# Patient Record
Sex: Male | Born: 1995 | Race: White | Hispanic: No | Marital: Single | Smoking: Current every day smoker
Health system: Southern US, Community
[De-identification: ages and names within clinical notes are randomized; demographics above are authoritative.]

## PROBLEM LIST (undated history)

## (undated) DIAGNOSIS — J329 Chronic sinusitis, unspecified: Secondary | ICD-10-CM

## (undated) HISTORY — PX: APPENDECTOMY: SHX54

---
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
## 2013-08-02 ENCOUNTER — Emergency Department (HOSPITAL_BASED_OUTPATIENT_CLINIC_OR_DEPARTMENT_OTHER)
Admission: EM | Admit: 2013-08-02 | Discharge: 2013-08-02 | Disposition: A | Discharge status: Home / Self Care | Payer: No Typology Code available for payment source | Attending: Emergency Medicine | Admitting: Emergency Medicine

## 2013-08-02 ENCOUNTER — Encounter (HOSPITAL_BASED_OUTPATIENT_CLINIC_OR_DEPARTMENT_OTHER): Payer: Self-pay | Admitting: Emergency Medicine

## 2013-08-02 ENCOUNTER — Emergency Department (HOSPITAL_BASED_OUTPATIENT_CLINIC_OR_DEPARTMENT_OTHER): Payer: No Typology Code available for payment source

## 2013-08-02 DIAGNOSIS — R55 Syncope and collapse: Secondary | ICD-10-CM | POA: Insufficient documentation

## 2013-08-02 DIAGNOSIS — S1093XA Contusion of unspecified part of neck, initial encounter: Secondary | ICD-10-CM

## 2013-08-02 DIAGNOSIS — Y92410 Unspecified street and highway as the place of occurrence of the external cause: Secondary | ICD-10-CM | POA: Insufficient documentation

## 2013-08-02 DIAGNOSIS — S060XAA Concussion with loss of consciousness status unknown, initial encounter: Secondary | ICD-10-CM | POA: Insufficient documentation

## 2013-08-02 DIAGNOSIS — S0990XA Unspecified injury of head, initial encounter: Secondary | ICD-10-CM

## 2013-08-02 DIAGNOSIS — S0083XA Contusion of other part of head, initial encounter: Secondary | ICD-10-CM

## 2013-08-02 DIAGNOSIS — S0003XA Contusion of scalp, initial encounter: Secondary | ICD-10-CM | POA: Insufficient documentation

## 2013-08-02 DIAGNOSIS — Y9389 Activity, other specified: Secondary | ICD-10-CM | POA: Insufficient documentation

## 2013-08-02 DIAGNOSIS — S060X9A Concussion with loss of consciousness of unspecified duration, initial encounter: Secondary | ICD-10-CM

## 2013-08-02 MED ORDER — TRAMADOL HCL 50 MG PO TABS: 50.0000 mg | ORAL_TABLET | Freq: Four times a day (QID) | ORAL | Status: DC | PRN

## 2013-08-02 NOTE — ED Notes (Signed)
Pt called and requested a return to work note for 3/27 instead of 3/26. Dr Blinda LeatherwoodPollina advised and note will be left at front desk for pt.

## 2013-08-02 NOTE — Discharge Instructions (Signed)

## 2013-08-02 NOTE — ED Notes (Signed)
MD at bedside discussing results. 
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
## 2014-09-23 ENCOUNTER — Encounter (HOSPITAL_COMMUNITY): Payer: Self-pay

## 2014-09-23 ENCOUNTER — Emergency Department (HOSPITAL_COMMUNITY)
Admission: EM | Admit: 2014-09-23 | Discharge: 2014-09-24 | Disposition: A | Discharge status: Other Acute Inpatient Hospital | Payer: No Typology Code available for payment source | Attending: Emergency Medicine | Admitting: Emergency Medicine

## 2014-09-23 DIAGNOSIS — F419 Anxiety disorder, unspecified: Secondary | ICD-10-CM | POA: Insufficient documentation

## 2014-09-23 DIAGNOSIS — F1210 Cannabis abuse, uncomplicated: Secondary | ICD-10-CM | POA: Insufficient documentation

## 2014-09-23 DIAGNOSIS — R45851 Suicidal ideations: Secondary | ICD-10-CM

## 2014-09-23 DIAGNOSIS — F329 Major depressive disorder, single episode, unspecified: Secondary | ICD-10-CM | POA: Insufficient documentation

## 2014-09-23 LAB — COMPREHENSIVE METABOLIC PANEL
ALT: 121 U/L — AB (ref 17–63)
AST: 64 U/L — ABNORMAL HIGH (ref 15–41)
Albumin: 4.6 g/dL (ref 3.5–5.0)
Alkaline Phosphatase: 56 U/L (ref 38–126)
Anion gap: 9 (ref 5–15)
BUN: 7 mg/dL (ref 6–20)
CO2: 25 mmol/L (ref 22–32)
Calcium: 9.4 mg/dL (ref 8.9–10.3)
Chloride: 103 mmol/L (ref 101–111)
Creatinine, Ser: 0.91 mg/dL (ref 0.61–1.24)
GFR calc Af Amer: >60 mL/min (ref >60)
Glucose, Bld: 110 mg/dL — ABNORMAL HIGH (ref 65–99)
Potassium: 4.1 mmol/L (ref 3.5–5.1)
Sodium: 137 mmol/L (ref 135–145)
Total Bilirubin: 0.7 mg/dL (ref 0.3–1.2)
Total Protein: 7.6 g/dL (ref 6.5–8.1)

## 2014-09-23 LAB — CBC
HCT: 45.6 % (ref 39.0–52.0)
Hemoglobin: 16.1 g/dL (ref 13.0–17.0)
MCH: 30.4 pg (ref 26.0–34.0)
MCHC: 35.3 g/dL (ref 30.0–36.0)
MCV: 86 fL (ref 78.0–100.0)
Platelets: 221 10*3/uL (ref 150–400)
RBC: 5.3 MIL/uL (ref 4.22–5.81)
RDW: 12.5 % (ref 11.5–15.5)
WBC: 11.6 10*3/uL — ABNORMAL HIGH (ref 4.0–10.5)

## 2014-09-23 LAB — RAPID URINE DRUG SCREEN, HOSP PERFORMED
Amphetamines: NOT DETECTED
Barbiturates: NOT DETECTED
Benzodiazepines: NOT DETECTED
Cocaine: NOT DETECTED
OPIATES: NOT DETECTED
TETRAHYDROCANNABINOL: POSITIVE — AB

## 2014-09-23 LAB — ETHANOL: Alcohol, Ethyl (B): <5 mg/dL (ref <5)

## 2014-09-23 LAB — SALICYLATE LEVEL

## 2014-09-23 LAB — ACETAMINOPHEN LEVEL: Acetaminophen (Tylenol), Serum: <10 ug/mL — ABNORMAL LOW (ref 10–30)

## 2014-09-23 MED ORDER — NICOTINE 21 MG/24HR TD PT24
21.0000 mg | MEDICATED_PATCH | Freq: Every day | TRANSDERMAL | Status: DC
  Administered 2014-09-24 (×2): 21 mg via TRANSDERMAL
  Filled 2014-09-23 (×2): qty 1

## 2014-09-23 MED ORDER — ZOLPIDEM TARTRATE 5 MG PO TABS: 5.0000 mg | ORAL_TABLET | Freq: Every evening | ORAL | Status: DC | PRN

## 2014-09-23 MED ORDER — LORAZEPAM 1 MG PO TABS
1.0000 mg | ORAL_TABLET | Freq: Three times a day (TID) | ORAL | Status: DC | PRN
  Administered 2014-09-24: 1 mg via ORAL
  Filled 2014-09-23: qty 1

## 2014-09-23 MED ORDER — ONDANSETRON HCL 4 MG PO TABS: 4.0000 mg | ORAL_TABLET | Freq: Three times a day (TID) | ORAL | Status: DC | PRN

## 2014-09-23 MED ORDER — ACETAMINOPHEN 325 MG PO TABS
650.0000 mg | ORAL_TABLET | ORAL | Status: DC | PRN
  Administered 2014-09-24: 650 mg via ORAL
  Filled 2014-09-23: qty 2

## 2014-09-23 NOTE — ED Notes (Signed)
Staffing notified of need for sitter.  
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
## 2014-09-23 NOTE — ED Provider Notes (Signed)
CSN: [id number]     Arrival date & time 09/23/14  2203 History   First MD Initiated Contact with Patient 09/23/14 2212     Chief Complaint  Patient presents with  . Suicidal     (Consider location/radiation/quality/duration/timing/severity/associated sxs/prior Treatment) HPI Comments: Patient is a 19 year old male with no sick and past medical history who presents to the emergency department for further evaluation of suicidal thoughts. Patient states that he has been having worsening suicidal thoughts and depression of the past 5 years. He denies seeing a therapist or psychiatrist in the past. He has never been medicated for his depression, but has attempted self medicating with marijuana. He does not do this anymore, per patient. He states that he has had thoughts of jumping off of a bridge. He does report also trying to hang himself with a belt when he was 19 years old. Patient states that he sometimes is aware that his heart is beating very rapidly. He has had difficulty sleeping over the past 3 days. Patient reporting vague homicidal ideations and agitation as well. No homicidal thoughts. Patient denies any recent alcohol or illicit drug use.  The history is provided by the patient. No language interpreter was used.    History reviewed. No pertinent past medical history. History reviewed. No pertinent past surgical history. History reviewed. No pertinent family history. History  Substance Use Topics  . Smoking status: Never Smoker   . Smokeless tobacco: Not on file  . Alcohol Use: Yes     Comment: ocasional    Review of Systems  Psychiatric/Behavioral: Positive for suicidal ideas and behavioral problems. The patient is nervous/anxious.   All other systems reviewed and are negative.   Allergies  Review of patient's allergies indicates no known allergies.  Home Medications   Prior to Admission medications   Medication Sig Start Date End Date Taking? Authorizing Provider   traMADol (ULTRAM) 50 MG tablet Take 1 tablet (50 mg total) by mouth every 6 (six) hours as needed. Patient not taking: Reported on 09/24/2014 08/02/13   Gilda Creasehristopher J Pollina, MD   BP 161/96 mmHg  Pulse 93  Temp(Src) 98.4 F (36.9 C) (Oral)  Resp 18  Ht 6' (1.829 m)  Wt 245 lb (111.131 kg)  BMI 33.22 kg/m2  SpO2 97%   Physical Exam  Constitutional: He is oriented to person, place, and time. He appears well-developed and well-nourished. No distress.  HENT:  Head: Normocephalic and atraumatic.  Eyes: Conjunctivae and EOM are normal. No scleral icterus.  Neck: Normal range of motion.  Cardiovascular: Normal rate, regular rhythm and intact distal pulses.   Pulmonary/Chest: Effort normal. No respiratory distress.  Musculoskeletal: Normal range of motion.  Neurological: He is alert and oriented to person, place, and time. He exhibits normal muscle tone. Coordination normal.  Skin: Skin is warm and dry. No rash noted. He is not diaphoretic. No erythema. No pallor.  Psychiatric: He has a normal mood and affect. His speech is normal and behavior is normal. He expresses homicidal and suicidal ideation. He expresses suicidal plans. He expresses no homicidal plans.  Patient calm and cooperative  Nursing note and vitals reviewed.   ED Course  Procedures (including critical care time) Labs Review Labs Reviewed  CBC - Abnormal; Notable for the following:    WBC 11.6 (*)    All other components within normal limits  COMPREHENSIVE METABOLIC PANEL - Abnormal; Notable for the following:    Glucose, Bld 110 (*)    AST 64 (*)  ALT 121 (*)    All other components within normal limits  ACETAMINOPHEN LEVEL - Abnormal; Notable for the following:    Acetaminophen (Tylenol), Serum <10 (*)    All other components within normal limits  URINE RAPID DRUG SCREEN (HOSP PERFORMED) - Abnormal; Notable for the following:    Tetrahydrocannabinol POSITIVE (*)    All other components within normal limits   ETHANOL  SALICYLATE LEVEL    Imaging Review No results found.   EKG Interpretation None      MDM   Final diagnoses:  Depression with suicidal ideation    19 y/o male presents for depression and SI. Patient voluntary. He has been medically cleared and pending inpatient placement. Disposition to be determined by oncoming ED provider.   Filed Vitals:   09/23/14 2213  BP: 161/96  Pulse: 93  Temp: 98.4 F (36.9 C)  TempSrc: Oral  Resp: 18  Height: 6' (1.829 m)  Weight: 245 lb (111.131 kg)  SpO2: 97%       Antony MaduraKelly Bassy Fetterly, PA-C 09/24/14 [id number]  Benjiman CoreNathan Pickering, MD 09/26/14 [phone]

## 2014-09-23 NOTE — ED Notes (Signed)
Pt reports Si with plan. sts on the way here tried to open door, has thoughts of jumping off bridge, pt reports general body aches as well, sts thought has been going on for several years.

## 2014-09-24 NOTE — Progress Notes (Signed)
CSW seeking inpatient placement for pt.  Ref[facility] faxed to: [facility] ([facility])- per Iona HansenPaula [facility]- per Jill SideAlison, possible adult male beds today [facility]- [facility]- per Lowella Fair[facility]- per Olivia Mackieressa, bed availability unknown but will review when beds come available  At capacity for adult units: [facility]- [facility]- [facility]- per [facility] ("can check back this evening for changes") [facility]- per [facility]ia  Ilean SkillMeghan Ashara Lounsbury, MSW, LCSWA Clinical Social Work, Disposition  09/24/2014 [phone]

## 2014-09-24 NOTE — ED Notes (Signed)
Nicotine patch removed by patient. Pt sts it is shooting pain throughout his body. Pt given acetaminophen for pain relief

## 2014-09-24 NOTE — ED Notes (Signed)
Pt report given to Till[facility]Laura Blount [facility]

## 2014-09-24 NOTE — BH Assessment (Signed)
Assessment completed. Consulted Ijeoma Nwaeze, NP who agrees that pt meets inpatient criteria. TTS will contact other facilities for placement. Kelly Humes, PA-C has been informed of the recommendation.  

## 2014-09-24 NOTE — Progress Notes (Signed)
Patient being reviewed for potential placement at [facility].  Bernard Waters, MSW, LCSW, LCAS Clinical Social Worker [phone]

## 2014-09-24 NOTE — Progress Notes (Signed)
[facility] by Dr. Wendall StadeKohl per Morrie SheldonAshley. Report [id number]. Morrie Sheldonshley states pt can be admitted voluntarily if willing to sign consents, but will need to be under IVC if not willing to sign himself in. [facility] SW spoke with pt who confirms he is willing to [facility], therefore IVC unneeded at this time.  [facility] regarding pt's placement.   Ilean SkillMeghan Hector Venne, MSW, LCSWA Clinical Social Work, Disposition  09/24/2014 [phone]

## 2014-09-24 NOTE — ED Notes (Signed)
Pt in shower room with sitter at stand by.

## 2014-09-24 NOTE — ED Notes (Signed)
Pt sts belongings are with family

## 2014-09-24 NOTE — ED Notes (Addendum)
Pt reports SI thoughts of jumping off the car while coming here. Also thought of jumping off bridge few days ago when coming from work. Pt states he has access to his relatives guns, thought of using once against himself. Pt states stressors include his job, relationships with family members, and mistakes he has had made along the way in his life.

## 2014-09-24 NOTE — ED Provider Notes (Addendum)
Alert, pleasant cooperative. Sitting up in bed watching TV denies complaint. Results for orders placed or performed during the hospital encounter of 09/23/14  CBC  Result Value Ref Range   WBC 11.6 (H) 4.0 - 10.5 K/uL   RBC 5.30 4.22 - 5.81 MIL/uL   Hemoglobin 16.1 13.0 - 17.0 g/dL   HCT 62.945.6 52.839.0 - 41.352.0 %   MCV 86.0 78.0 - 100.0 fL   MCH 30.4 26.0 - 34.0 pg   MCHC 35.3 30.0 - 36.0 g/dL   RDW 24.412.5 01.011.5 - 27.215.5 %   Platelets 221 150 - 400 K/uL  Comprehensive metabolic panel  Result Value Ref Range   Sodium 137 135 - 145 mmol/L   Potassium 4.1 3.5 - 5.1 mmol/L   Chloride 103 101 - 111 mmol/L   CO2 25 22 - 32 mmol/L   Glucose, Bld 110 (H) 65 - 99 mg/dL   BUN 7 6 - 20 mg/dL   Creatinine, Ser 5.360.91 0.61 - 1.24 mg/dL   Calcium 9.4 8.9 - 64.410.3 mg/dL   Total Protein 7.6 6.5 - 8.1 g/dL   Albumin 4.6 3.5 - 5.0 g/dL   AST 64 (H) 15 - 41 U/L   ALT 121 (H) 17 - 63 U/L   Alkaline Phosphatase 56 38 - 126 U/L   Total Bilirubin 0.7 0.3 - 1.2 mg/dL   GFR calc non Af Amer >60 >60 mL/min   GFR calc Af Amer >60 >60 mL/min   Anion gap 9 5 - 15  Ethanol (ETOH)  Result Value Ref Range   Alcohol, Ethyl (B) <5 <5 mg/dL  Acetaminophen level  Result Value Ref Range   Acetaminophen (Tylenol), Serum <10 (L) 10 - 30 ug/mL  Salicylate level  Result Value Ref Range   Salicylate Lvl <4.0 2.8 - 30.0 mg/dL  Urine rapid drug screen (hosp performed)  Result Value Ref Range   Opiates NONE DETECTED NONE DETECTED   Cocaine NONE DETECTED NONE DETECTED   Benzodiazepines NONE DETECTED NONE DETECTED   Amphetamines NONE DETECTED NONE DETECTED   Tetrahydrocannabinol POSITIVE (A) NONE DETECTED   Barbiturates NONE DETECTED NONE DETECTED   No results found.   Doug SouSam Zanylah Hardie, MD 09/24/14 [phone]  12 noon [facility]. He is alert Glasgow Coma Score 15 appears comfortable. Requesting medication for "diffuse body aches" Tylenol has been previously ordered  Doug SouSam  Rogelio Winbush, MD 09/24/14 1204

## 2014-09-24 NOTE — ED Notes (Addendum)
[phone] Bernard LewandowskyEricka Cerritos, pt mother. Patient confirmed he would like mother aware of when he is placed and where.

## 2014-09-24 NOTE — Progress Notes (Signed)
Patient has been accepted to [place]. CSW met with patient who is agreeable to sign himself in voluntarily.  OV is agreeable to accept him for today. Bed:  [facility] Unit A Report number:  [phone] Transportation: Betsy Pries Accepting MD:  Claudie Revering  Patient reports he will make family aware of his plan. RN made aware of plan.  Lane Hacker, MSW Clinical Social Work: Emergency Room [phone]

## 2015-08-25 ENCOUNTER — Encounter (HOSPITAL_COMMUNITY): Payer: Self-pay

## 2015-08-25 DIAGNOSIS — S0990XA Unspecified injury of head, initial encounter: Secondary | ICD-10-CM | POA: Insufficient documentation

## 2015-08-25 DIAGNOSIS — S3992XA Unspecified injury of lower back, initial encounter: Secondary | ICD-10-CM | POA: Insufficient documentation

## 2015-08-25 DIAGNOSIS — Y998 Other external cause status: Secondary | ICD-10-CM | POA: Insufficient documentation

## 2015-08-25 DIAGNOSIS — S40211A Abrasion of right shoulder, initial encounter: Secondary | ICD-10-CM | POA: Insufficient documentation

## 2015-08-25 DIAGNOSIS — Y9289 Other specified places as the place of occurrence of the external cause: Secondary | ICD-10-CM | POA: Insufficient documentation

## 2015-08-25 DIAGNOSIS — Y9389 Activity, other specified: Secondary | ICD-10-CM | POA: Insufficient documentation

## 2015-08-25 DIAGNOSIS — S79922A Unspecified injury of left thigh, initial encounter: Secondary | ICD-10-CM | POA: Insufficient documentation
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
## 2015-08-26 ENCOUNTER — Emergency Department (HOSPITAL_COMMUNITY): Payer: No Typology Code available for payment source

## 2015-08-26 ENCOUNTER — Emergency Department (HOSPITAL_COMMUNITY)
Admission: EM | Admit: 2015-08-26 | Discharge: 2015-08-26 | Disposition: A | Discharge status: Home / Self Care | Payer: No Typology Code available for payment source | Attending: Emergency Medicine | Admitting: Emergency Medicine

## 2015-08-26 DIAGNOSIS — S0990XA Unspecified injury of head, initial encounter: Secondary | ICD-10-CM

## 2015-08-26 DIAGNOSIS — M25511 Pain in right shoulder: Secondary | ICD-10-CM

## 2015-08-26 DIAGNOSIS — M545 Low back pain: Secondary | ICD-10-CM

## 2015-08-26 DIAGNOSIS — M79652 Pain in left thigh: Secondary | ICD-10-CM

## 2015-08-26 MED ORDER — IBUPROFEN 400 MG PO TABS
600.0000 mg | ORAL_TABLET | Freq: Once | ORAL | Status: AC
  Administered 2015-08-26: 600 mg via ORAL
  Filled 2015-08-26: qty 1

## 2015-08-26 MED ORDER — OXYCODONE-ACETAMINOPHEN 5-325 MG PO TABS
1.0000 | ORAL_TABLET | Freq: Once | ORAL | Status: AC
  Administered 2015-08-26: 1 via ORAL
  Filled 2015-08-26: qty 1

## 2015-08-26 MED ORDER — IBUPROFEN 600 MG PO TABS: 600.0000 mg | ORAL_TABLET | Freq: Three times a day (TID) | ORAL | Status: DC | PRN

## 2015-08-26 NOTE — Discharge Instructions (Signed)
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
## 2015-12-30 ENCOUNTER — Emergency Department (HOSPITAL_COMMUNITY)
Admission: EM | Admit: 2015-12-30 | Discharge: 2015-12-30 | Disposition: A | Discharge status: Home / Self Care | Payer: No Typology Code available for payment source | Attending: Emergency Medicine | Admitting: Emergency Medicine

## 2015-12-30 ENCOUNTER — Encounter (HOSPITAL_COMMUNITY): Payer: Self-pay | Admitting: Emergency Medicine

## 2015-12-30 ENCOUNTER — Emergency Department (HOSPITAL_COMMUNITY): Payer: No Typology Code available for payment source

## 2015-12-30 DIAGNOSIS — W228XXA Striking against or struck by other objects, initial encounter: Secondary | ICD-10-CM | POA: Insufficient documentation

## 2015-12-30 DIAGNOSIS — Y9301 Activity, walking, marching and hiking: Secondary | ICD-10-CM | POA: Insufficient documentation

## 2015-12-30 DIAGNOSIS — Y999 Unspecified external cause status: Secondary | ICD-10-CM | POA: Insufficient documentation

## 2015-12-30 DIAGNOSIS — Z791 Long term (current) use of non-steroidal anti-inflammatories (NSAID): Secondary | ICD-10-CM | POA: Insufficient documentation

## 2015-12-30 DIAGNOSIS — S92515A Nondisplaced fracture of proximal phalanx of left lesser toe(s), initial encounter for closed fracture: Secondary | ICD-10-CM | POA: Insufficient documentation

## 2015-12-30 DIAGNOSIS — S92502A Displaced unspecified fracture of left lesser toe(s), initial encounter for closed fracture: Secondary | ICD-10-CM

## 2015-12-30 DIAGNOSIS — Y9289 Other specified places as the place of occurrence of the external cause: Secondary | ICD-10-CM | POA: Insufficient documentation

## 2015-12-30 MED ORDER — NAPROXEN 250 MG PO TABS: 250.0000 mg | ORAL_TABLET | Freq: Two times a day (BID) | ORAL | 0 refills | Status: DC | PRN

## 2015-12-30 NOTE — Discharge Instructions (Signed)
Take the prescription as directed.  Take over the counter tylenol, as directed on packaging, as needed for discomfort. "Buddy tape" your toes daily, as instructed in the Emergency Department; and wear the hard-soled shoe until you are seen in follow up. Call your regular medical doctor or the Orthopedic doctor today to schedule a follow up appointment within the next week.  Return to the Emergency Department immediately sooner if worsening.
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
## 2016-09-07 ENCOUNTER — Encounter (HOSPITAL_COMMUNITY): Payer: Self-pay | Admitting: Emergency Medicine

## 2016-09-07 DIAGNOSIS — K353 Acute appendicitis with localized peritonitis: Principal | ICD-10-CM | POA: Insufficient documentation

## 2016-09-07 DIAGNOSIS — F17210 Nicotine dependence, cigarettes, uncomplicated: Secondary | ICD-10-CM | POA: Diagnosis not present

## 2016-09-07 DIAGNOSIS — R109 Unspecified abdominal pain: Secondary | ICD-10-CM | POA: Diagnosis present

## 2016-09-07 LAB — CBC WITH DIFFERENTIAL/PLATELET
BASOS ABS: 0 10*3/uL (ref 0.0–0.1)
BASOS PCT: 0 %
EOS PCT: 0 %
Eosinophils Absolute: 0.1 10*3/uL (ref 0.0–0.7)
HCT: 46.9 % (ref 39.0–52.0)
Hemoglobin: 16.4 g/dL (ref 13.0–17.0)
LYMPHS PCT: 17 %
Lymphs Abs: 3 10*3/uL (ref 0.7–4.0)
MCH: 30.5 pg (ref 26.0–34.0)
MCHC: 35 g/dL (ref 30.0–36.0)
MCV: 87.3 fL (ref 78.0–100.0)
MONO ABS: 0.9 10*3/uL (ref 0.1–1.0)
Monocytes Relative: 5 %
Neutro Abs: 13 10*3/uL — ABNORMAL HIGH (ref 1.7–7.7)
Neutrophils Relative %: 78 %
PLATELETS: 260 10*3/uL (ref 150–400)
RBC: 5.37 MIL/uL (ref 4.22–5.81)
RDW: 12.7 % (ref 11.5–15.5)
WBC: 16.9 10*3/uL — ABNORMAL HIGH (ref 4.0–10.5)

## 2016-09-07 LAB — BASIC METABOLIC PANEL
Anion gap: 12 (ref 5–15)
BUN: 8 mg/dL (ref 6–20)
CALCIUM: 9.9 mg/dL (ref 8.9–10.3)
CO2: 24 mmol/L (ref 22–32)
Chloride: 103 mmol/L (ref 101–111)
Creatinine, Ser: 0.99 mg/dL (ref 0.61–1.24)
GFR calc Af Amer: >60 mL/min (ref >60)
Glucose, Bld: 128 mg/dL — ABNORMAL HIGH (ref 65–99)
Potassium: 4.6 mmol/L (ref 3.5–5.1)
Sodium: 139 mmol/L (ref 135–145)

## 2016-09-07 MED ORDER — ONDANSETRON 4 MG PO TBDP
4.0000 mg | ORAL_TABLET | Freq: Once | ORAL | Status: AC
  Administered 2016-09-07: 4 mg via ORAL

## 2016-09-07 MED ORDER — ONDANSETRON 4 MG PO TBDP
ORAL_TABLET | ORAL | Status: AC
Start: 2016-09-07 — End: 2016-09-07
  Filled 2016-09-07: qty 1

## 2016-09-07 NOTE — ED Triage Notes (Signed)
Pt c/o 9/10 mid HA that started today at 5 pm and he is vomiting since then.

## 2016-09-08 ENCOUNTER — Emergency Department (HOSPITAL_COMMUNITY): Payer: BLUE CROSS/BLUE SHIELD

## 2016-09-08 ENCOUNTER — Observation Stay (HOSPITAL_COMMUNITY): Payer: BLUE CROSS/BLUE SHIELD | Admitting: Anesthesiology

## 2016-09-08 ENCOUNTER — Encounter (HOSPITAL_COMMUNITY): Payer: Self-pay | Admitting: Anesthesiology

## 2016-09-08 ENCOUNTER — Observation Stay (HOSPITAL_COMMUNITY)
Admission: EM | Admit: 2016-09-08 | Discharge: 2016-09-08 | Disposition: A | Discharge status: Home / Self Care | Payer: BLUE CROSS/BLUE SHIELD | Attending: Surgery | Admitting: Surgery

## 2016-09-08 ENCOUNTER — Encounter: Payer: Self-pay | Admitting: General Surgery

## 2016-09-08 ENCOUNTER — Encounter (HOSPITAL_COMMUNITY)
Admission: EM | Disposition: A | Discharge status: Home / Self Care | Payer: Self-pay | Source: Home / Self Care | Attending: Emergency Medicine

## 2016-09-08 DIAGNOSIS — K3580 Unspecified acute appendicitis: Secondary | ICD-10-CM | POA: Diagnosis present

## 2016-09-08 DIAGNOSIS — K353 Acute appendicitis with localized peritonitis, without perforation or gangrene: Secondary | ICD-10-CM

## 2016-09-08 HISTORY — PX: LAPAROSCOPIC APPENDECTOMY: SHX408

## 2016-09-08 LAB — URINALYSIS, ROUTINE W REFLEX MICROSCOPIC
BILIRUBIN URINE: NEGATIVE
Bacteria, UA: NONE SEEN
GLUCOSE, UA: NEGATIVE mg/dL
KETONES UR: 20 mg/dL — AB
LEUKOCYTES UA: NEGATIVE
Nitrite: NEGATIVE
PH: 6 (ref 5.0–8.0)
Protein, ur: NEGATIVE mg/dL
Specific Gravity, Urine: 1.014 (ref 1.005–1.030)
Squamous Epithelial / LPF: NONE SEEN

## 2016-09-08 LAB — SURGICAL PCR SCREEN
MRSA, PCR: NEGATIVE
STAPHYLOCOCCUS AUREUS: NEGATIVE

## 2016-09-08 LAB — HIV ANTIBODY (ROUTINE TESTING W REFLEX): HIV SCREEN 4TH GENERATION: NONREACTIVE

## 2016-09-08 SURGERY — APPENDECTOMY, LAPAROSCOPIC
Anesthesia: General | Site: Abdomen

## 2016-09-08 MED ORDER — SODIUM CHLORIDE 0.9 % IV BOLUS (SEPSIS)
1000.0000 mL | Freq: Once | INTRAVENOUS | Status: AC
  Administered 2016-09-08: 1000 mL via INTRAVENOUS

## 2016-09-08 MED ORDER — SCOPOLAMINE 1 MG/3DAYS TD PT72
1.0000 | MEDICATED_PATCH | TRANSDERMAL | Status: DC
  Administered 2016-09-08: 1.5 mg via TRANSDERMAL

## 2016-09-08 MED ORDER — ONDANSETRON HCL 4 MG/2ML IJ SOLN: 4.0000 mg | Freq: Four times a day (QID) | INTRAMUSCULAR | Status: DC | PRN

## 2016-09-08 MED ORDER — MIDAZOLAM HCL 5 MG/5ML IJ SOLN
INTRAMUSCULAR | Status: DC | PRN
  Administered 2016-09-08: 2 mg via INTRAVENOUS

## 2016-09-08 MED ORDER — SODIUM CHLORIDE 0.9 % IR SOLN
Status: DC | PRN
  Administered 2016-09-08: 1

## 2016-09-08 MED ORDER — IOPAMIDOL (ISOVUE-300) INJECTION 61%
INTRAVENOUS | Status: AC
Start: 2016-09-08 — End: 2016-09-08
  Administered 2016-09-08: 02:00:00
  Filled 2016-09-08: qty 100

## 2016-09-08 MED ORDER — LACTATED RINGERS IV SOLN
INTRAVENOUS | Status: DC
  Administered 2016-09-08 (×3): via INTRAVENOUS

## 2016-09-08 MED ORDER — ONDANSETRON HCL 4 MG/2ML IJ SOLN
INTRAMUSCULAR | Status: AC
  Filled 2016-09-08: qty 2

## 2016-09-08 MED ORDER — HYDROMORPHONE HCL 1 MG/ML IJ SOLN
1.0000 mg | INTRAMUSCULAR | Status: DC | PRN
  Administered 2016-09-08: 1 mg via INTRAVENOUS
  Filled 2016-09-08: qty 1

## 2016-09-08 MED ORDER — ONDANSETRON HCL 4 MG/2ML IJ SOLN
4.0000 mg | Freq: Once | INTRAMUSCULAR | Status: AC
  Administered 2016-09-08: 4 mg via INTRAVENOUS
  Filled 2016-09-08: qty 2

## 2016-09-08 MED ORDER — ENOXAPARIN SODIUM 40 MG/0.4ML ~~LOC~~ SOLN: 40.0000 mg | SUBCUTANEOUS | Status: DC

## 2016-09-08 MED ORDER — PHENYLEPHRINE 40 MCG/ML (10ML) SYRINGE FOR IV PUSH (FOR BLOOD PRESSURE SUPPORT)
PREFILLED_SYRINGE | INTRAVENOUS | Status: AC
  Filled 2016-09-08: qty 10

## 2016-09-08 MED ORDER — PHENYLEPHRINE HCL 10 MG/ML IJ SOLN
INTRAMUSCULAR | Status: DC | PRN
  Administered 2016-09-08 (×2): 80 ug via INTRAVENOUS

## 2016-09-08 MED ORDER — OXYCODONE HCL 5 MG PO TABS: ORAL_TABLET | ORAL | 0 refills | Status: DC

## 2016-09-08 MED ORDER — SUGAMMADEX SODIUM 500 MG/5ML IV SOLN
INTRAVENOUS | Status: DC | PRN
  Administered 2016-09-08: 450 mg via INTRAVENOUS

## 2016-09-08 MED ORDER — BUPIVACAINE HCL (PF) 0.25 % IJ SOLN
INTRAMUSCULAR | Status: AC
  Filled 2016-09-08: qty 30

## 2016-09-08 MED ORDER — PROPOFOL 10 MG/ML IV BOLUS
INTRAVENOUS | Status: AC
  Filled 2016-09-08: qty 20

## 2016-09-08 MED ORDER — IBUPROFEN 200 MG PO TABS: ORAL_TABLET | ORAL | Status: AC

## 2016-09-08 MED ORDER — FENTANYL CITRATE (PF) 100 MCG/2ML IJ SOLN
50.0000 ug | Freq: Once | INTRAMUSCULAR | Status: AC
  Administered 2016-09-08: 50 ug via INTRAVENOUS
  Filled 2016-09-08: qty 2

## 2016-09-08 MED ORDER — ONDANSETRON HCL 4 MG/2ML IJ SOLN
INTRAMUSCULAR | Status: DC | PRN
  Administered 2016-09-08: 4 mg via INTRAVENOUS

## 2016-09-08 MED ORDER — HYDROMORPHONE HCL 1 MG/ML IJ SOLN: 0.2500 mg | INTRAMUSCULAR | Status: DC | PRN

## 2016-09-08 MED ORDER — ONDANSETRON 4 MG PO TBDP: 4.0000 mg | ORAL_TABLET | Freq: Four times a day (QID) | ORAL | Status: DC | PRN

## 2016-09-08 MED ORDER — NICOTINE 7 MG/24HR TD PT24
7.0000 mg | MEDICATED_PATCH | Freq: Once | TRANSDERMAL | Status: DC
  Administered 2016-09-08: 7 mg via TRANSDERMAL
  Filled 2016-09-08 (×2): qty 1

## 2016-09-08 MED ORDER — OXYCODONE HCL 5 MG PO TABS: 5.0000 mg | ORAL_TABLET | ORAL | Status: DC | PRN

## 2016-09-08 MED ORDER — METRONIDAZOLE IN NACL 5-0.79 MG/ML-% IV SOLN
500.0000 mg | Freq: Once | INTRAVENOUS | Status: AC
  Administered 2016-09-08: 500 mg via INTRAVENOUS
  Filled 2016-09-08: qty 100

## 2016-09-08 MED ORDER — ACETAMINOPHEN 500 MG PO TABS
1000.0000 mg | ORAL_TABLET | Freq: Three times a day (TID) | ORAL | Status: DC
  Administered 2016-09-08: 1000 mg via ORAL
  Filled 2016-09-08: qty 2

## 2016-09-08 MED ORDER — SCOPOLAMINE 1 MG/3DAYS TD PT72
MEDICATED_PATCH | TRANSDERMAL | Status: AC
  Filled 2016-09-08: qty 1

## 2016-09-08 MED ORDER — LIDOCAINE HCL (CARDIAC) 20 MG/ML IV SOLN
INTRAVENOUS | Status: DC | PRN
  Administered 2016-09-08: 100 mg via INTRAVENOUS

## 2016-09-08 MED ORDER — SUCCINYLCHOLINE CHLORIDE 200 MG/10ML IV SOSY
PREFILLED_SYRINGE | INTRAVENOUS | Status: AC
  Filled 2016-09-08: qty 10

## 2016-09-08 MED ORDER — SUGAMMADEX SODIUM 500 MG/5ML IV SOLN
INTRAVENOUS | Status: AC
  Filled 2016-09-08: qty 5

## 2016-09-08 MED ORDER — ROCURONIUM BROMIDE 100 MG/10ML IV SOLN
INTRAVENOUS | Status: DC | PRN
  Administered 2016-09-08: 60 mg via INTRAVENOUS

## 2016-09-08 MED ORDER — IBUPROFEN 600 MG PO TABS: 600.0000 mg | ORAL_TABLET | Freq: Four times a day (QID) | ORAL | Status: DC | PRN

## 2016-09-08 MED ORDER — SUCCINYLCHOLINE CHLORIDE 20 MG/ML IJ SOLN
INTRAMUSCULAR | Status: DC | PRN
  Administered 2016-09-08: 120 mg via INTRAVENOUS

## 2016-09-08 MED ORDER — KCL IN DEXTROSE-NACL 20-5-0.9 MEQ/L-%-% IV SOLN
INTRAVENOUS | Status: DC
  Administered 2016-09-08: 12:00:00 via INTRAVENOUS
  Filled 2016-09-08: qty 1000

## 2016-09-08 MED ORDER — PROMETHAZINE HCL 25 MG/ML IJ SOLN: 6.2500 mg | INTRAMUSCULAR | Status: DC | PRN

## 2016-09-08 MED ORDER — DEXTROSE 5 % IV SOLN
2.0000 g | Freq: Once | INTRAVENOUS | Status: AC
Start: 2016-09-08 — End: 2016-09-08
  Administered 2016-09-08: 2 g via INTRAVENOUS
  Filled 2016-09-08: qty 2

## 2016-09-08 MED ORDER — ROCURONIUM BROMIDE 10 MG/ML (PF) SYRINGE
PREFILLED_SYRINGE | INTRAVENOUS | Status: AC
  Filled 2016-09-08: qty 5

## 2016-09-08 MED ORDER — FENTANYL CITRATE (PF) 100 MCG/2ML IJ SOLN
INTRAMUSCULAR | Status: DC | PRN
  Administered 2016-09-08: 100 ug via INTRAVENOUS
  Administered 2016-09-08: 50 ug via INTRAVENOUS
  Administered 2016-09-08 (×2): 100 ug via INTRAVENOUS
  Administered 2016-09-08: 50 ug via INTRAVENOUS
  Administered 2016-09-08: 100 ug via INTRAVENOUS

## 2016-09-08 MED ORDER — PROPOFOL 10 MG/ML IV BOLUS
INTRAVENOUS | Status: DC | PRN
  Administered 2016-09-08: 200 mg via INTRAVENOUS

## 2016-09-08 MED ORDER — ACETAMINOPHEN 500 MG PO TABS: 1000.0000 mg | ORAL_TABLET | Freq: Three times a day (TID) | ORAL | 0 refills | Status: AC

## 2016-09-08 MED ORDER — FENTANYL CITRATE (PF) 250 MCG/5ML IJ SOLN
INTRAMUSCULAR | Status: AC
  Filled 2016-09-08: qty 10

## 2016-09-08 MED ORDER — 0.9 % SODIUM CHLORIDE (POUR BTL) OPTIME
TOPICAL | Status: DC | PRN
  Administered 2016-09-08: 1000 mL

## 2016-09-08 MED ORDER — LIDOCAINE 2% (20 MG/ML) 5 ML SYRINGE
INTRAMUSCULAR | Status: AC
  Filled 2016-09-08: qty 5

## 2016-09-08 MED ORDER — MIDAZOLAM HCL 2 MG/2ML IJ SOLN
INTRAMUSCULAR | Status: AC
  Filled 2016-09-08: qty 2

## 2016-09-08 MED ORDER — BUPIVACAINE-EPINEPHRINE 0.25% -1:200000 IJ SOLN
INTRAMUSCULAR | Status: DC | PRN
  Administered 2016-09-08: 8 mL

## 2016-09-08 SURGICAL SUPPLY — 48 items
APL SKNCLS STERI-STRIP NONHPOA (GAUZE/BANDAGES/DRESSINGS)
APPLIER CLIP ROT 10 11.4 M/L (STAPLE)
APR CLP MED LRG 11.4X10 (STAPLE)
BAG SPEC RTRVL LRG 6X4 10 (ENDOMECHANICALS) ×1
BENZOIN TINCTURE PRP APPL 2/3 (GAUZE/BANDAGES/DRESSINGS) ×1 IMPLANT
BLADE CLIPPER SURG (BLADE) ×2 IMPLANT
CANISTER SUCT 3000ML PPV (MISCELLANEOUS) ×3 IMPLANT
CHLORAPREP W/TINT 26ML (MISCELLANEOUS) ×3 IMPLANT
CLIP APPLIE ROT 10 11.4 M/L (STAPLE) IMPLANT
CLOSURE WOUND 1/2 X4 (GAUZE/BANDAGES/DRESSINGS)
COVER SURGICAL LIGHT HANDLE (MISCELLANEOUS) ×3 IMPLANT
CUTTER FLEX LINEAR 45M (STAPLE) ×3 IMPLANT
DRSG TEGADERM 2-3/8X2-3/4 SM (GAUZE/BANDAGES/DRESSINGS) ×6 IMPLANT
DRSG TEGADERM 4X4.75 (GAUZE/BANDAGES/DRESSINGS) ×3 IMPLANT
ELECT REM PT RETURN 9FT ADLT (ELECTROSURGICAL) ×3
ELECTRODE REM PT RTRN 9FT ADLT (ELECTROSURGICAL) ×1 IMPLANT
ENDOLOOP SUT PDS II  0 18 (SUTURE)
ENDOLOOP SUT PDS II 0 18 (SUTURE) IMPLANT
FILTER SMOKE EVAC LAPAROSHD (FILTER) ×1 IMPLANT
GAUZE SPONGE 2X2 8PLY STRL LF (GAUZE/BANDAGES/DRESSINGS) ×1 IMPLANT
GLOVE BIO SURGEON STRL SZ7 (GLOVE) ×3 IMPLANT
GLOVE BIOGEL PI IND STRL 7.0 (GLOVE) IMPLANT
GLOVE BIOGEL PI IND STRL 7.5 (GLOVE) ×1 IMPLANT
GLOVE BIOGEL PI INDICATOR 7.0 (GLOVE) ×2
GLOVE BIOGEL PI INDICATOR 7.5 (GLOVE) ×2
GLOVE SURG SS PI 7.0 STRL IVOR (GLOVE) ×2 IMPLANT
GOWN STRL REUS W/ TWL LRG LVL3 (GOWN DISPOSABLE) ×3 IMPLANT
GOWN STRL REUS W/TWL LRG LVL3 (GOWN DISPOSABLE) ×9
KIT BASIN OR (CUSTOM PROCEDURE TRAY) ×3 IMPLANT
KIT ROOM TURNOVER OR (KITS) ×3 IMPLANT
NS IRRIG 1000ML POUR BTL (IV SOLUTION) ×3 IMPLANT
PAD ARMBOARD 7.5X6 YLW CONV (MISCELLANEOUS) ×6 IMPLANT
POUCH SPECIMEN RETRIEVAL 10MM (ENDOMECHANICALS) ×3 IMPLANT
RELOAD STAPLE 45 3.5 BLU ETS (ENDOMECHANICALS) ×1 IMPLANT
RELOAD STAPLE TA45 3.5 REG BLU (ENDOMECHANICALS) ×3 IMPLANT
SET IRRIG TUBING LAPAROSCOPIC (IRRIGATION / IRRIGATOR) ×3 IMPLANT
SHEARS HARMONIC ACE PLUS 36CM (ENDOMECHANICALS) ×3 IMPLANT
SLEEVE ENDOPATH XCEL 5M (ENDOMECHANICALS) ×3 IMPLANT
SPECIMEN JAR SMALL (MISCELLANEOUS) ×3 IMPLANT
SPONGE GAUZE 2X2 STER 10/PKG (GAUZE/BANDAGES/DRESSINGS)
STRIP CLOSURE SKIN 1/2X4 (GAUZE/BANDAGES/DRESSINGS) ×1 IMPLANT
SUT MNCRL AB 4-0 PS2 18 (SUTURE) ×3 IMPLANT
TOWEL OR 17X24 6PK STRL BLUE (TOWEL DISPOSABLE) ×3 IMPLANT
TOWEL OR 17X26 10 PK STRL BLUE (TOWEL DISPOSABLE) ×3 IMPLANT
TRAY LAPAROSCOPIC MC (CUSTOM PROCEDURE TRAY) ×3 IMPLANT
TROCAR XCEL BLUNT TIP 100MML (ENDOMECHANICALS) ×3 IMPLANT
TROCAR XCEL NON-BLD 5MMX100MML (ENDOMECHANICALS) ×3 IMPLANT
TUBING INSUFFLATION (TUBING) ×3 IMPLANT

## 2016-09-08 NOTE — Anesthesia Procedure Notes (Signed)
Procedure Name: Intubation Date/Time: 09/08/2016 9:15 AM Performed by: Greggory Stallion, Katessa Attridge L Pre-anesthesia Checklist: Patient identified, Emergency Drugs available, Suction available and Patient being monitored Patient Re-evaluated:Patient Re-evaluated prior to inductionOxygen Delivery Method: Circle System Utilized Preoxygenation: Pre-oxygenation with 100% oxygen Intubation Type: IV induction, Rapid sequence and Cricoid Pressure applied Laryngoscope Size: Mac and 4 Grade View: Grade I Tube type: Oral Tube size: 8.0 mm Number of attempts: 1 Airway Equipment and Method: Stylet and Oral airway Placement Confirmation: ETT inserted through vocal cords under direct vision,  positive ETCO2 and breath sounds checked- equal and bilateral Secured at: 23 cm Tube secured with: Tape Dental Injury: Teeth and Oropharynx as per pre-operative assessment

## 2016-09-08 NOTE — Discharge Summary (Signed)
Physician Discharge Summary  Patient ID: Bernard Waters MRN: [id number] DOB/AGE: October 30, 1995 21 y.o.  Admit date: 09/08/2016 Discharge date: 09/08/2016  Admission Diagnoses:  Acute appendicitis Tobacco use  Discharge Diagnoses:  Acute appendicitis Tobacco use  Active Problems:   Acute appendicitis   PROCEDURES: Laparoscopic appendectomy 09/08/16, Dr. Candice Camp Course:   This is a 21 year old otherwise healthy gentleman who presents with right lower quadrant abdominal pain with nausea and vomiting. He describes the pain is sharp and severe. It hurts worse with motion. The pain started approximately 12 hours ago or so. He denies fever or chills. Bowel movements a been normal. Patient was seen in the ED and admitted by Dr. Magnus Ivan. Following a.m. he was evaluated by Dr. Corliss Skains and taken the operating room. Postop is doing well, tolerating diet.  He doesn't take ibuprofen works for him, he has Tylenol and were going to try him on some oxycodone. If he does well will plan discharge later this afternoon.  I have received discharge instructions,and medications with him. I have personally reviewed the patients medication history on the [place] controlled substance database. We also discussed tobacco  and recommended he discontinue;  he also has instructions and is discharged.   CBC Latest Ref Rng & Units 09/07/2016 09/23/2014  WBC 4.0 - 10.5 K/uL 16.9(H) 11.6(H)  Hemoglobin 13.0 - 17.0 g/dL 01.0 27.2  Hematocrit 53.6 - 52.0 % 46.9 45.6  Platelets 150 - 400 K/uL 260 221   CMP Latest Ref Rng & Units 09/07/2016 09/23/2014  Glucose 65 - 99 mg/dL 644(I) 347(Q)  BUN 6 - 20 mg/dL 8 7  Creatinine 2.59 - 1.24 mg/dL 5.63 8.75  Sodium 643 - 145 mmol/L 139 137  Potassium 3.5 - 5.1 mmol/L 4.6 4.1  Chloride 101 - 111 mmol/L 103 103  CO2 22 - 32 mmol/L 24 25  Calcium 8.9 - 10.3 mg/dL 9.9 9.4  Total Protein 6.5 - 8.1 g/dL - 7.6  Total Bilirubin 0.3 - 1.2 mg/dL - 0.7  Alkaline Phos 38 - 126 U/L  - 56  AST 15 - 41 U/L - 64(H)  ALT 17 - 63 U/L - 121(H)   Condition ON discharge: Improving    Disposition: 01-Home or Self Care   Allergies as of 09/08/2016      Reactions   No Known Allergies       Medication List    TAKE these medications   acetaminophen 500 MG tablet Commonly known as:  TYLENOL Take 2 tablets (1,000 mg total) by mouth every 8 (eight) hours.   ibuprofen 200 MG tablet Commonly known as:  ADVIL,MOTRIN You can take 2-3 tablets safely every 6 hours as needed for pain. You can use this to augment Tylenol, or you can use it as a first line pain medication in place of Tylenol.   oxyCODONE 5 MG immediate release tablet Commonly known as:  Oxy IR/ROXICODONE Use this for pain not relieved by Tylenol or ibuprofen. We will not refill this prescription.      Follow-up Information    [facility] Follow up.   Specialty:  General Surgery Why:  The office should call you with follow-up appointment in the [facility]. Be at the office 30 minutes early for check-in. Pre-insurance and photo ID. Contact information: [address] [place] [address] [phone]           Signed: Sherrie George 09/08/2016, 3:45 PM

## 2016-09-08 NOTE — ED Provider Notes (Signed)
[facility]-EMERGENCY DEPT Provider Note   CSN: [id number] Arrival date & time: 09/07/16  1953  By signing my name below, I, Elder Negus, attest that this documentation has been prepared under the direction and in the presence of Glynn Octave, MD. Electronically Signed: Elder Negus, Scribe. 09/08/16. 12:28 AM.   History   Chief Complaint Chief Complaint  Patient presents with  . Headache  . Emesis    HPI Bernard Waters is a 21 y.o. male without any chronic medical problems who presents to the ED for evaluation of abdominal pain. This patient states that in the last 8 hours he has experienced frequent vomiting that later progressed into severe RLQ abdominal pain. He is also reporting a gradual onset bifrontal headache with photophobia and phonophobia at interview. No diarrhea. No decreased sensation or focal weaknesses. No fevers. No neck pain.   The history is provided by the patient. No language interpreter was used.  Headache   This is a new problem. The current episode started 6 to 12 hours ago. The problem occurs constantly. The problem has not changed since onset.The headache is associated with bright light and loud noise. The pain is located in the frontal region. The pain is moderate. The pain does not radiate. Associated symptoms include nausea and vomiting. Pertinent negatives include no fever. He has tried nothing for the symptoms.    History reviewed. No pertinent past medical history.  There are no active problems to display for this patient.   History reviewed. No pertinent surgical history.     Home Medications    Prior to Admission medications   Medication Sig Start Date End Date Taking? Authorizing Provider  ibuprofen (ADVIL,MOTRIN) 600 MG tablet Take 1 tablet (600 mg total) by mouth every 8 (eight) hours as needed. 08/26/15   Azalia Bilis, MD  naproxen (NAPROSYN) 250 MG tablet Take 1 tablet (250 mg total) by mouth 2 (two) times daily as needed for mild  pain or moderate pain (take with food). 12/30/15   Samuel Jester, DO    Family History History reviewed. No pertinent family history.  Social History Social History  Substance Use Topics  . Smoking status: Current Every Day Smoker    Packs/day: 0.50    Types: Cigarettes  . Smokeless tobacco: Current User    Types: Chew  . Alcohol use Yes     Comment: ocasional     Allergies   Patient has no known allergies.   Review of Systems Review of Systems  Constitutional: Negative for fever.  Gastrointestinal: Positive for abdominal pain, nausea and vomiting. Negative for diarrhea.  Musculoskeletal: Negative for neck pain.  Neurological: Negative for weakness and numbness.  All other systems reviewed and are negative.    Physical Exam Updated Vital Signs BP (!) 156/102 (BP Location: Right Arm)   Pulse 60   Temp 98.5 F (36.9 C) (Oral)   Resp 18   Ht 6' (1.829 m)   Wt 244 lb (110.7 kg)   SpO2 99%   BMI 33.09 kg/m   Physical Exam  Constitutional: He is oriented to person, place, and time. He appears well-developed and well-nourished. No distress.  Appears uncomfortable.  HENT:  Head: Normocephalic and atraumatic.  Mouth/Throat: Oropharynx is clear and moist. No oropharyngeal exudate.  Eyes: Conjunctivae and EOM are normal. Pupils are equal, round, and reactive to light.  Photophobic.  Neck: Normal range of motion. Neck supple.  No meningismus.  Cardiovascular: Normal rate, regular rhythm, normal heart sounds and intact distal  pulses.   No murmur heard. Pulmonary/Chest: Effort normal and breath sounds normal. No respiratory distress.  Abdominal: Soft. There is no rebound.  There is RLQ tenderness with voluntary guarding.  Genitourinary:  Genitourinary Comments: No testicular tenderness.  Musculoskeletal: Normal range of motion. He exhibits no edema or tenderness.  No CVA tenderness.  Neurological: He is alert and oriented to person, place, and time. No cranial  nerve deficit. He exhibits normal muscle tone. Coordination normal.   5/5 strength throughout. CN 2-12 intact.Equal grip strength.   Skin: Skin is warm.  Psychiatric: He has a normal mood and affect. His behavior is normal.  Nursing note and vitals reviewed.    ED Treatments / Results  Labs (all labs ordered are listed, but only abnormal results are displayed) Labs Reviewed  CBC WITH DIFFERENTIAL/PLATELET - Abnormal; Notable for the following:       Result Value   WBC 16.9 (*)    Neutro Abs 13.0 (*)    All other components within normal limits  BASIC METABOLIC PANEL - Abnormal; Notable for the following:    Glucose, Bld 128 (*)    All other components within normal limits  URINALYSIS, ROUTINE W REFLEX MICROSCOPIC - Abnormal; Notable for the following:    Hgb urine dipstick SMALL (*)    Ketones, ur 20 (*)    All other components within normal limits  SURGICAL PCR SCREEN  HIV ANTIBODY (ROUTINE TESTING)    EKG  EKG Interpretation None       Radiology Ct Head Wo Contrast  Result Date: 09/08/2016 CLINICAL DATA:  Headache, onset at 17:00 and accompanied by nausea. EXAM: CT HEAD WITHOUT CONTRAST TECHNIQUE: Contiguous axial images were obtained from the base of the skull through the vertex without intravenous contrast. COMPARISON:  None. FINDINGS: Brain: There is no intracranial hemorrhage, mass or evidence of acute infarction. There is no extra-axial fluid collection. Gray matter and white matter appear normal. Cerebral volume is normal for age. Brainstem and posterior fossa are unremarkable. The CSF spaces appear normal. Vascular: No hyperdense vessel or unexpected calcification. Skull: Normal. Negative for fracture or focal lesion. Sinuses/Orbits: Retention cysts in the right maxillary sinus, incompletely imaged. Other: None. IMPRESSION: Normal brain Electronically Signed   By: Ellery Plunk M.D.   On: 09/08/2016 03:39   Ct Abdomen Pelvis W Contrast  Result Date:  09/08/2016 CLINICAL DATA:  Right lower quadrant pain.  Vomiting. EXAM: CT ABDOMEN AND PELVIS WITH CONTRAST TECHNIQUE: Multidetector CT imaging of the abdomen and pelvis was performed using the standard protocol following bolus administration of intravenous contrast. CONTRAST:  <See Chart> ISOVUE-300 IOPAMIDOL (ISOVUE-300) INJECTION 61% COMPARISON:  None. FINDINGS: Lower chest: No acute abnormality. Hepatobiliary: Diffuse fatty infiltration of the liver. Mild attenuation irregularity adjacent to the gallbladder fossa may represent transient attenuation differences. No suspicious liver lesion. Gallbladder and bile ducts are unremarkable. Pancreas: Unremarkable. No pancreatic ductal dilatation or surrounding inflammatory changes. Spleen: Normal in size without focal abnormality. Adrenals/Urinary Tract: Adrenal glands are unremarkable. Kidneys are normal, without renal calculi, focal lesion, or hydronephrosis. Bladder is unremarkable. Stomach/Bowel: Stomach and small bowel are normal. The appendix is not optimally seen but I believe it is enlarged and inflamed. No drainable abscess. No extraluminal gas. Colon is normal. Vascular/Lymphatic: No significant vascular findings are present. No enlarged abdominal or pelvic lymph nodes. Reproductive: Unremarkable Other: No ascites. Musculoskeletal: No significant skeletal lesion. IMPRESSION: Appendix is not optimally visible but it is probably enlarged and inflamed. Suspicious for acute appendicitis. No drainable collection.  These results were called by telephone at the time of interpretation on 09/08/2016 at 3:51 am to Dr. Glynn Octave , who verbally acknowledged these results. Electronically Signed   By: Ellery Plunk M.D.   On: 09/08/2016 03:53    Procedures Procedures (including critical care time)  Medications Ordered in ED Medications  ondansetron (ZOFRAN-ODT) 4 MG disintegrating tablet (not administered)  ondansetron (ZOFRAN-ODT) disintegrating tablet 4 mg  (4 mg Oral Given 09/07/16 2032)     Initial Impression / Assessment and Plan / ED Course  I have reviewed the triage vital signs and the nursing notes.  Pertinent labs & imaging results that were available during my care of the patient were reviewed by me and considered in my medical decision making (see chart for details).      Patient presents with lower abdominal pain, vomiting and headache. Denies fever. Denies sudden onset headache.  Patient with right lower quadrant tenderness. Left shoulder leukocytosis. Suspicion for appendicitis.  Labs with leukocytosis.  CT with appendicitis. CT head negative.  Headache has resolved.  D/w Dr Magnus Ivan who  Will admit.  IV antibiotics given. Final Clinical Impressions(s) / ED Diagnoses   Final diagnoses:  Acute appendicitis with localized peritonitis    New Prescriptions New Prescriptions   No medications on file  I personally performed the services described in this documentation, which was scribed in my presence. The recorded information has been reviewed and is accurate.    Glynn Octave, MD 09/08/16 [phone]

## 2016-09-08 NOTE — ED Notes (Signed)
Patient is layin g calmly on the stretcher stating he is having a 10/10 pain

## 2016-09-08 NOTE — ED Notes (Signed)
Nurse currently starting IV and drawing labs. 

## 2016-09-08 NOTE — Progress Notes (Signed)
MD Tsuei rounded on patient and said patient wanted to put a shirt on and said we could saline lock his IV. He said we would round on patient later and if doing okay would d/c him later.

## 2016-09-08 NOTE — Anesthesia Postprocedure Evaluation (Addendum)
Anesthesia Post Note  Patient: Bernard Waters [facility]  Procedure(s) Performed: Procedure(s) (LRB): APPENDECTOMY LAPAROSCOPIC (N/A)  Patient location during evaluation: PACU Anesthesia Type: General Level of consciousness: sedated Pain management: pain level controlled Vital Signs Assessment: post-procedure vital signs reviewed and stable Respiratory status: spontaneous breathing and respiratory function stable Cardiovascular status: stable Anesthetic complications: no       Last Vitals:  Vitals:   09/08/16 1025 09/08/16 1030  BP: 120/72   Pulse: 69 73  Resp: 17 14  Temp:      Last Pain:  Vitals:   09/08/16 1030  TempSrc:   PainSc: 0-No pain                 Niyana Chesbro DANIEL

## 2016-09-08 NOTE — Discharge Instructions (Signed)
Laparoscopic Appendectomy, Adult, Care After  These instructions give you information about caring for yourself after your procedure. Your doctor may also give you more specific instructions. Call your doctor if you have any problems or questions after your procedure.  Follow these instructions at home:  Medicines   · Take over-the-counter and prescription medicines only as told by your doctor.  · Do not drive for 24 hours if you received a sedative.  · Do not drive or use heavy machinery while taking prescription pain medicine.  · If you were prescribed an antibiotic medicine, take it as told by your doctor. Do not stop taking it even if you start to feel better.  Activity   · Do not lift anything that is heavier than 10 pounds (4.5 kg) for 3 weeks or as told by your doctor.  · Do not play contact sports for 3 weeks or as told by your doctor.  · Slowly return to your normal activities.  Bathing   · Keep your cuts from surgery (incisions) clean and dry.  ? Gently wash the cuts with soap and water.  ? Rinse the cuts with water until the soap is gone.  ? Pat the cuts dry with a clean towel. Do not rub the cuts.  · You may take showers after 48 hours.  · Do not take baths, swim, or use a hot tub for 2 weeks or as told by your doctor.  Cut Care   · Follow instructions from your doctor about how to take care of your cuts. Make sure you:  ? Wash your hands with soap and water before you change your bandage (dressing). If you do not have soap and water, use hand sanitizer.  ? Change your bandage as told by your doctor.  ? Leave stitches (sutures), skin glue, or skin tape (adhesive) strips in place. They may need to stay in place for 2 weeks or longer. If tape strips get loose and curl up, you may trim the loose edges. Do not remove tape strips completely unless your doctor says it is okay.  · Check your cuts every day for signs of infection. Check for:  ? More redness, swelling, or pain.  ? More fluid or  blood.  ? Warmth.  ? Pus or a bad smell.  Other Instructions   · If you were sent home with a drain, follow instructions from your doctor about how to use it and care for it.  · Take deep breaths. This helps to keep your lungs from getting swollen (inflamed).  · To help with constipation:  ? Drink plenty of fluids.  ? Eat plenty of fruits and vegetables.  · Keep all follow-up visits as told by your doctor. This is important.  Contact a doctor if:  · You have more redness, swelling, or pain around a cut from surgery.  · You have more fluid or blood coming from a cut.  · Your cut feels warm to the touch.  · You have pus or a bad smell coming from a cut or a bandage.  · The edges of a cut break open after the stitches have been taken out.  · You have pain in your shoulders that gets worse.  · You feel dizzy or you pass out (faint).  · You have shortness of breath.  · You keep feeling sick to your stomach (nauseous).  · You keep throwing up (vomiting).  · You get diarrhea or you cannot control your   intended to replace advice given to you by your health care provider. Make sure you discuss any questions you have with your health care provider. Document Released: 02/21/2009 Document Revised: 10/03/2015 Document Reviewed: 10/15/2014 Elsevier Interactive Patient Education  2017 [employer]   [facility]S ______[facility] LAPAROSCOPIC SURGERY: POST OP INSTRUCTIONS Always review your discharge instruction sheet given to you by the facility where your surgery was performed. IF YOU HAVE DISABILITY OR FAMILY LEAVE FORMS, YOU MUST BRING THEM TO THE OFFICE FOR PROCESSING.   DO NOT GIVE THEM TO YOUR DOCTOR.  1. A prescription for pain medication may be given to you upon  discharge.  Take your pain medication as prescribed, if needed.  If narcotic pain medicine is not needed, then you may take acetaminophen (Tylenol) or ibuprofen (Advil) as needed. 2. Take your usually prescribed medications unless otherwise directed. 3. If you need a refill on your pain medication, please contact your pharmacy.  They will contact our office to request authorization. Prescriptions will not be filled after 5pm or on week-ends. 4. You should follow a light diet the first few days after arrival home, such as soup and crackers, etc.  Be sure to include lots of fluids daily. 5. Most patients will experience some swelling and bruising in the area of the incisions.  Ice packs will help.  Swelling and bruising can take several days to resolve.  6. It is common to experience some constipation if taking pain medication after surgery.  Increasing fluid intake and taking a stool softener (such as Colace) will usually help or prevent this problem from occurring.  A mild laxative (Milk of Magnesia or Miralax) should be taken according to package instructions if there are no bowel movements after 48 hours. 7. Unless discharge instructions indicate otherwise, you may remove your bandages 24-48 hours after surgery, and you may shower at that time.  You may have steri-strips (small skin tapes) in place directly over the incision.  These strips should be left on the skin for 7-10 days.  If your surgeon used skin glue on the incision, you may shower in 24 hours.  The glue will flake off over the next 2-3 weeks.  Any sutures or staples will be removed at the office during your follow-up visit. 8. ACTIVITIES:  You may resume regular (light) daily activities beginning the next day--such as daily self-care, walking, climbing stairs--gradually increasing activities as tolerated.  You may have sexual intercourse when it is comfortable.  Refrain from any heavy lifting or straining until approved by your doctor. a. You  may drive when you are no longer taking prescription pain medication, you can comfortably wear a seatbelt, and you can safely maneuver your car and apply brakes. b. RETURN TO WORK:  __________________________________________________________ 9. You should see your doctor in the office for a follow-up appointment approximately 2-3 weeks after your surgery.  Make sure that you call for this appointment within a day or two after you arrive home to insure a convenient appointment time. 10. OTHER INSTRUCTIONS: __________________________________________________________________________________________________________________________ __________________________________________________________________________________________________________________________ WHEN TO CALL YOUR DOCTOR: 1. Fever over 101.0 2. Inability to urinate 3. Continued bleeding from incision. 4. Increased pain, redness, or drainage from the incision. 5. Increasing abdominal pain  The clinic staff is available to answer your questions during regular business hours.  Please dont hesitate to call and ask to speak to one of the nurses for clinical concerns.  If you have a medical emergency, go to the nearest emergency room or  call 911.  A surgeon from [facility] is always on call at the hospital. [address], [place], [place]  [address] ? [address] [address], [place], [place]   [address] [phone] ? [phone] ? FAX [phone] Web site: [email or web address]   Tobacco Use Disorder Tobacco use disorder (TUD) is a mental disorder. It is the long-term use of tobacco in spite of related health problems or difficulty with normal life activities. Tobacco is most commonly smoked as cigarettes and less commonly as cigars or pipes. Smokeless chewing tobacco and snuff are also popular. People with TUD get a feeling of extreme pleasure (euphoria) from using tobacco and have a desire to use it again and again.  Repeated use of tobacco can cause problems. The addictive effects of tobacco are due mainly tothe ingredient nicotine. Nicotine also causes a rush of adrenaline (epinephrine) in the body. This leads to increased blood pressure, heart rate, and breathing rate. These changes may cause problems for people with high blood pressure, weak hearts, or lung disease. High doses of nicotine in children and pets can lead to seizures and death. Tobacco contains a number of other unsafe chemicals. These chemicals are especially harmful when inhaled as smoke and can damage almost every organ in the body. Smokers live shorter lives than nonsmokers and are at risk of dying from a number of diseases and cancers. Tobacco smoke can also cause health problems for nonsmokers (due to inhaling secondhand smoke). Smoking is also a fire hazard. TUD usually starts in the late teenage years and is most common in young adults between the ages of 22 and 25 years. People who start smoking earlier in life are more likely to continue smoking as adults. TUD is somewhat more common in men than women. People with TUD are at higher risk for using alcohol and other drugs of abuse. What increases the risk? Risk factors for TUD include:  Having family members with the disorder.  Being around people who use tobacco.  Having an existing mental health issue such as schizophrenia, depression, bipolar disorder, ADHD, or posttraumatic stress disorder (PTSD). What are the signs or symptoms? People with tobacco use disorder have two or more of the following signs and symptoms within 12 months:  Use of more tobacco over a longer period than intended.  Not able to cut down or control tobacco use.  A lot of time spent obtaining or using tobacco.  Strong desire or urge to use tobacco (craving). Cravings may last for 6 months or longer after quitting.  Use of tobacco even when use leads to major problems at work, school, or home.  Use of  tobacco even when use leads to relationship problems.  Giving up or cutting down on important life activities because of tobacco use.  Repeatedly using tobacco in situations where it puts you or others in physical danger, like smoking in bed.  Use of tobacco even when it is known that a physical or mental problem is likely related to tobacco use.  Physical problems are numerous and may include chronic bronchitis, emphysema, lung and other cancers, gum disease, high blood pressure, heart disease, and stroke.  Mental problems caused by tobacco may include difficulty sleeping and anxiety.  Need to use greater amounts of tobacco to get the same effect. This means you have developed a tolerance.  Withdrawal symptoms as a result of stopping or rapidly cutting back use. These symptoms may last a month or more after quitting and include  the following:  Depressed, anxious, or irritable mood.  Difficulty concentrating.  Increased appetite.  Restlessness or trouble sleeping.  Use of tobacco to avoid withdrawal symptoms. How is this diagnosed? Tobacco use disorder is diagnosed by your health care provider. A diagnosis may be made by:  Your health care provider asking questions about your tobacco use and any problems it may be causing.  A physical exam.  Lab tests.  You may be referred to a mental health professional or addiction specialist. The severity of tobacco use disorder depends on the number of signs and symptoms you have:  Mild--Two or three symptoms.  Moderate--Four or five symptoms.  Severe--Six or more symptoms. How is this treated? Many people with tobacco use disorder are unable to quit on their own and need help. Treatment options include the following:  Nicotine replacement therapy (NRT). NRT provides nicotine without the other harmful chemicals in tobacco. NRT gradually lowers the dosage of nicotine in the body and reduces withdrawal symptoms. NRT is available in  over-the-counter forms (gum, lozenges, and skin patches) as well as prescription forms (mouth inhaler and nasal spray).  Medicines.This may include:  Antidepressant medicine that may reduce nicotine cravings.  A medicine that acts on nicotine receptors in the brain to reduce cravings and withdrawal symptoms. It may also block the effects of tobacco in people with TUD who relapse.  Counseling or talk therapy. A form of talk therapy called behavioral therapy is commonly used to treat people with TUD. Behavioral therapy looks at triggers for tobacco use, how to avoid them, and how to cope with cravings. It is most effective in person or by phone but is also available in self-help forms (books and Internet websites).  Support groups. These provide emotional support, advice, and guidance for quitting tobacco. The most effective treatment for TUD is usually a combination of medicine, talk therapy, and support groups. Follow these instructions at home:  Keep all follow-up visits as directed by your health care provider. This is important.  Take medicines only as directed by your health care provider.  Check with your health care provider before starting new prescription or over-the-counter medicines. Contact a health care provider if:  You are not able to take your medicines as prescribed.  Treatment is not helping your TUD and your symptoms get worse. Get help right away if:  You have serious thoughts about hurting yourself or others.  You have trouble breathing, chest pain, sudden weakness, or sudden numbness in part of your body. This information is not intended to replace advice given to you by your health care provider. Make sure you discuss any questions you have with your health care provider. Document Released: 01/01/2004 Document Revised: 12/29/2015 Document Reviewed: 06/23/2013 Elsevier Interactive Patient Education  2017 [employer].

## 2016-09-08 NOTE — Anesthesia Preprocedure Evaluation (Addendum)
Anesthesia Evaluation  Patient identified by MRN, date of birth, ID band Patient awake    Reviewed: Allergy & Precautions, NPO status , Patient's Chart, lab work & pertinent test results  History of Anesthesia Complications Negative for: history of anesthetic complications  Airway Mallampati: II  TM Distance: >3 FB Neck ROM: Full    Dental no notable dental hx. (+) Dental Advisory Given   Pulmonary Current Smoker,    Pulmonary exam normal        Cardiovascular negative cardio ROS Normal cardiovascular exam     Neuro/Psych negative neurological ROS  negative psych ROS   GI/Hepatic Neg liver ROS,   Endo/Other  negative endocrine ROS  Renal/GU negative Renal ROS  negative genitourinary   Musculoskeletal negative musculoskeletal ROS (+)   Abdominal   Peds negative pediatric ROS (+)  Hematology negative hematology ROS (+)   Anesthesia Other Findings   Reproductive/Obstetrics negative OB ROS                            Anesthesia Physical Anesthesia Plan  ASA: II  Anesthesia Plan: General   Post-op Pain Management:    Induction: Intravenous, Rapid sequence and Cricoid pressure planned  Airway Management Planned: Oral ETT  Additional Equipment:   Intra-op Plan:   Post-operative Plan: Extubation in OR  Informed Consent: I have reviewed the patients History and Physical, chart, labs and discussed the procedure including the risks, benefits and alternatives for the proposed anesthesia with the patient or authorized representative who has indicated his/her understanding and acceptance.   Dental advisory given  Plan Discussed with: CRNA and Anesthesiologist  Anesthesia Plan Comments:         Anesthesia Quick Evaluation

## 2016-09-08 NOTE — Op Note (Signed)
Appendectomy, Lap, Procedure Note  Indications: The patient presented with a history of right-sided abdominal pain. A CT scan revealed findings consistent with acute appendicitis.  Pre-operative Diagnosis: Acute appendicitis without mention of peritonitis  Post-operative Diagnosis: Same  Surgeon: Christoher Drudge K.   Assistants: none  Anesthesia: General endotracheal anesthesia  ASA Class: 1, E  Procedure Details  The patient was seen again in the Holding Room. The risks, benefits, complications, treatment options, and expected outcomes were discussed with the patient and/or family. The possibilities of reaction to medication, perforation of viscus, bleeding, recurrent infection, finding a normal appendix, the need for additional procedures, failure to diagnose a condition, and creating a complication requiring transfusion or operation were discussed. There was concurrence with the proposed plan and informed consent was obtained. The site of surgery was properly noted. The patient was taken to Operating Room, identified as [facility] and the procedure verified as Appendectomy. A Time Out was held and the above information confirmed.  The patient was placed in the supine position and general anesthesia was induced.  The abdomen was prepped and draped in a sterile fashion. A one centimeter infraumbilical incision was made.  Dissection was carried down to the fascia bluntly.  The fascia was incised vertically.  We entered the peritoneal cavity bluntly.  A pursestring suture was passed around the incision with a 0 Vicryl.  The Hasson cannula was introduced into the abdomen and the tails of the suture were used to hold the Hasson in place.   The pneumoperitoneum was then established maintaining a maximum pressure of 15 mmHg.  Additional 5 mm cannulas then placed in the left lower quadrant of the abdomen and the right upper quadrant under direct visualization. A careful evaluation of the entire  abdomen was carried out. The patient was placed in Trendelenburg and left lateral decubitus position.  The scope was moved to the right upper quadrant port site. The cecum was mobilized medially.  The appendix was identified adherent to the abdominal wall in the RLQ. The appendix was carefully dissected. The appendix was was skeletonized with the harmonic scalpel.   The appendix was divided at its base using an endo-GIA stapler. Minimal appendiceal stump was left in place. There was no evidence of bleeding, leakage, or complication after division of the appendix. Irrigation was also performed and irrigate suctioned from the abdomen as well.  The umbilical port site was closed with the purse string suture. There was no residual palpable fascial defect.  The trocar site skin wounds were closed with 4-0 Monocryl.  Instrument, sponge, and needle counts were correct at the conclusion of the case.   Findings: The appendix was found to be inflamed. There were not signs of necrosis.  There was not perforation. There was not abscess formation.  Estimated Blood Loss:  Minimal         Drains: none         Specimens: appendix         Complications:  None; patient tolerated the procedure well.         Disposition: PACU - hemodynamically stable.         Condition: stable  Wilmon Arms. Corliss Skains, MD, [facility]  General/ Trauma Surgery  09/08/2016 10:03 AM

## 2016-09-08 NOTE — Transfer of Care (Signed)
Immediate Anesthesia Transfer of Care Note  Patient: Bernard Waters [facility]  Procedure(s) Performed: Procedure(s): APPENDECTOMY LAPAROSCOPIC (N/A)  Patient Location: PACU  Anesthesia Type:General  Level of Consciousness: awake, alert , oriented and patient cooperative  Airway & Oxygen Therapy: Patient Spontanous Breathing and Patient connected to nasal cannula oxygen  Post-op Assessment: Report given to RN, Post -op Vital signs reviewed and stable and Patient moving all extremities  Post vital signs: Reviewed and stable  Last Vitals:  Vitals:   09/08/16 0339 09/08/16 0541  BP: (!) 167/97 (!) 146/87  Pulse: 65 64  Resp: 18 19  Temp:  36.8 C    Last Pain:  Vitals:   09/08/16 0622  TempSrc:   PainSc: 2       Patients Stated Pain Goal: 2 (09/08/16 0552)  Complications: No apparent anesthesia complications

## 2016-09-08 NOTE — Progress Notes (Signed)
Patient taken to OR for Appendectomy, family at bedside aware of procedure. Pre-op checklist complete and short stay called and given report.

## 2016-09-08 NOTE — ED Notes (Signed)
Report called to 6N

## 2016-09-08 NOTE — Progress Notes (Signed)
Bernard Waters to be D/C'd  per MD order. Discussed with the patient and all questions fully answered.  VSS, Skin clean, dry and intact without evidence of skin break down, no evidence of skin tears noted.  IV catheter discontinued intact. Site without signs and symptoms of complications. Dressing and pressure applied.  An After Visit Summary was printed and given to the patient. Patient received prescription.  D/c education completed with patient/family including follow up instructions, medication list, d/c activities limitations if indicated, with other d/c instructions as indicated by MD - patient able to verbalize understanding, all questions fully answered.   Patient instructed to return to ED, call 911, or call MD for any changes in condition.   Patient to be escorted via WC, and D/C home via private auto.

## 2016-09-08 NOTE — Progress Notes (Signed)
Patient returned to 6n30, alert and oriented, VSS, IV fluids running. Patient reports mild pain, declines medicine. Report received via phone. Will continue to monitor.

## 2016-09-08 NOTE — H&P (Signed)
Bernard Waters is an 21 y.o. male.   Chief Complaint: Right lower quadrant abdominal pain HPI: This is a 21 year old otherwise healthy gentleman who presents with right lower quadrant abdominal pain with nausea and vomiting. He describes the pain is sharp and severe. It hurts worse with motion. The pain started approximately 12 hours ago or so. He denies fever or chills. Bowel movements a been normal.  History reviewed. No pertinent past medical history.  History reviewed. No pertinent surgical history.  History reviewed. No pertinent family history. Social History:  reports that he has been smoking Cigarettes.  He has been smoking about 0.50 packs per day. His smokeless tobacco use includes Chew. He reports that he drinks alcohol. He reports that he uses drugs, including Marijuana.  Allergies: No Known Allergies  No prescriptions prior to admission.    Results for orders placed or performed during the hospital encounter of 09/08/16 (from the past 48 hour(s))  CBC with Differential     Status: Abnormal   Collection Time: 09/07/16  8:28 PM  Result Value Ref Range   WBC 16.9 (H) 4.0 - 10.5 K/uL   RBC 5.37 4.22 - 5.81 MIL/uL   Hemoglobin 16.4 13.0 - 17.0 g/dL   HCT 46.9 39.0 - 52.0 %   MCV 87.3 78.0 - 100.0 fL   MCH 30.5 26.0 - 34.0 pg   MCHC 35.0 30.0 - 36.0 g/dL   RDW 12.7 11.5 - 15.5 %   Platelets 260 150 - 400 K/uL   Neutrophils Relative % 78 %   Neutro Abs 13.0 (H) 1.7 - 7.7 K/uL   Lymphocytes Relative 17 %   Lymphs Abs 3.0 0.7 - 4.0 K/uL   Monocytes Relative 5 %   Monocytes Absolute 0.9 0.1 - 1.0 K/uL   Eosinophils Relative 0 %   Eosinophils Absolute 0.1 0.0 - 0.7 K/uL   Basophils Relative 0 %   Basophils Absolute 0.0 0.0 - 0.1 K/uL  Basic metabolic panel     Status: Abnormal   Collection Time: 09/07/16  8:28 PM  Result Value Ref Range   Sodium 139 135 - 145 mmol/L   Potassium 4.6 3.5 - 5.1 mmol/L   Chloride 103 101 - 111 mmol/L   CO2 24 22 - 32 mmol/L   Glucose,  Bld 128 (H) 65 - 99 mg/dL   BUN 8 6 - 20 mg/dL   Creatinine, Ser 0.99 0.61 - 1.24 mg/dL   Calcium 9.9 8.9 - 10.3 mg/dL   GFR calc non Af Amer >60 >60 mL/min   GFR calc Af Amer >60 >60 mL/min    Comment: (NOTE) The eGFR has been calculated using the CKD EPI equation. This calculation has not been validated in all clinical situations. eGFR's persistently <60 mL/min signify possible Chronic Kidney Disease.    Anion gap 12 5 - 15  Urinalysis, Routine w reflex microscopic     Status: Abnormal   Collection Time: 09/08/16 12:31 AM  Result Value Ref Range   Color, Urine YELLOW YELLOW   APPearance CLEAR CLEAR   Specific Gravity, Urine 1.014 1.005 - 1.030   pH 6.0 5.0 - 8.0   Glucose, UA NEGATIVE NEGATIVE mg/dL   Hgb urine dipstick SMALL (A) NEGATIVE   Bilirubin Urine NEGATIVE NEGATIVE   Ketones, ur 20 (A) NEGATIVE mg/dL   Protein, ur NEGATIVE NEGATIVE mg/dL   Nitrite NEGATIVE NEGATIVE   Leukocytes, UA NEGATIVE NEGATIVE   RBC / HPF 0-5 0 - 5 RBC/hpf   WBC, UA  0-5 0 - 5 WBC/hpf   Bacteria, UA NONE SEEN NONE SEEN   Squamous Epithelial / LPF NONE SEEN NONE SEEN   Mucous PRESENT    Ct Head Wo Contrast  Result Date: 09/08/2016 CLINICAL DATA:  Headache, onset at 17:00 and accompanied by nausea. EXAM: CT HEAD WITHOUT CONTRAST TECHNIQUE: Contiguous axial images were obtained from the base of the skull through the vertex without intravenous contrast. COMPARISON:  None. FINDINGS: Brain: There is no intracranial hemorrhage, mass or evidence of acute infarction. There is no extra-axial fluid collection. Gray matter and white matter appear normal. Cerebral volume is normal for age. Brainstem and posterior fossa are unremarkable. The CSF spaces appear normal. Vascular: No hyperdense vessel or unexpected calcification. Skull: Normal. Negative for fracture or focal lesion. Sinuses/Orbits: Retention cysts in the right maxillary sinus, incompletely imaged. Other: None. IMPRESSION: Normal brain Electronically  Signed   By: Andreas Newport M.D.   On: 09/08/2016 03:39   Ct Abdomen Pelvis W Contrast  Result Date: 09/08/2016 CLINICAL DATA:  Right lower quadrant pain.  Vomiting. EXAM: CT ABDOMEN AND PELVIS WITH CONTRAST TECHNIQUE: Multidetector CT imaging of the abdomen and pelvis was performed using the standard protocol following bolus administration of intravenous contrast. CONTRAST:  <See Chart> ISOVUE-300 IOPAMIDOL (ISOVUE-300) INJECTION 61% COMPARISON:  None. FINDINGS: Lower chest: No acute abnormality. Hepatobiliary: Diffuse fatty infiltration of the liver. Mild attenuation irregularity adjacent to the gallbladder fossa may represent transient attenuation differences. No suspicious liver lesion. Gallbladder and bile ducts are unremarkable. Pancreas: Unremarkable. No pancreatic ductal dilatation or surrounding inflammatory changes. Spleen: Normal in size without focal abnormality. Adrenals/Urinary Tract: Adrenal glands are unremarkable. Kidneys are normal, without renal calculi, focal lesion, or hydronephrosis. Bladder is unremarkable. Stomach/Bowel: Stomach and small bowel are normal. The appendix is not optimally seen but I believe it is enlarged and inflamed. No drainable abscess. No extraluminal gas. Colon is normal. Vascular/Lymphatic: No significant vascular findings are present. No enlarged abdominal or pelvic lymph nodes. Reproductive: Unremarkable Other: No ascites. Musculoskeletal: No significant skeletal lesion. IMPRESSION: Appendix is not optimally visible but it is probably enlarged and inflamed. Suspicious for acute appendicitis. No drainable collection. These results were called by telephone at the time of interpretation on 09/08/2016 at 3:51 am to Dr. Ezequiel Essex , who verbally acknowledged these results. Electronically Signed   By: Andreas Newport M.D.   On: 09/08/2016 03:53    Review of Systems  Constitutional: Negative for chills and fever.  Eyes: Negative for blurred vision.   Respiratory: Negative for cough, shortness of breath and stridor.   Cardiovascular: Negative for chest pain.  Gastrointestinal: Positive for abdominal pain, nausea and vomiting.  Genitourinary: Negative for dysuria.  Musculoskeletal: Negative for myalgias and neck pain.  Neurological: Positive for headaches. Negative for dizziness.  All other systems reviewed and are negative.   Blood pressure (!) 146/87, pulse 64, temperature 98.3 F (36.8 C), temperature source Oral, resp. rate 19, height 6' (1.829 m), weight 110.7 kg (244 lb), SpO2 98 %. Physical Exam  Constitutional: He is oriented to person, place, and time. He appears well-developed and well-nourished. No distress.  HENT:  Head: Normocephalic and atraumatic.  Right Ear: External ear normal.  Left Ear: External ear normal.  Nose: Nose normal.  Mouth/Throat: Oropharynx is clear and moist. No oropharyngeal exudate.  Eyes: Pupils are equal, round, and reactive to light. Right eye exhibits no discharge. No scleral icterus.  Neck: Normal range of motion. No tracheal deviation present.  Cardiovascular: Normal rate, regular  rhythm, normal heart sounds and intact distal pulses.   No murmur heard. Respiratory: Effort normal and breath sounds normal. No respiratory distress. He has no wheezes.  GI: Soft. He exhibits no distension. There is tenderness. There is rebound and guarding.  There is moderate to severe tenderness with guarding in the right lower quadrant  Musculoskeletal: Normal range of motion. He exhibits no edema.  Lymphadenopathy:    He has no cervical adenopathy.  Neurological: He is alert and oriented to person, place, and time.  Skin: Skin is warm and dry. No rash noted. He is not diaphoretic. No erythema.  Psychiatric: His behavior is normal. Judgment normal.     Assessment/Plan Acute appendicitis  I explained the diagnosis to the patient and his mother. Appendectomy is recommended. I discussed the laparoscopic  approach in detail including the risk of surgery. These risks include but are not limited to bleeding, infection, stump leak, need for further surgery, injury to surrounding structures, DVT, etc. IV antibiotics of artery been given. Surgery we planned for later today by Dr. Georgette Dover.  Harl Bowie, MD 09/08/2016, 6:35 AM

## 2016-09-09 ENCOUNTER — Encounter (HOSPITAL_COMMUNITY): Payer: Self-pay | Admitting: Surgery

## 2016-09-21 ENCOUNTER — Encounter (HOSPITAL_COMMUNITY): Payer: Self-pay | Admitting: Emergency Medicine

## 2016-09-21 ENCOUNTER — Emergency Department (HOSPITAL_COMMUNITY)
Admission: EM | Admit: 2016-09-21 | Discharge: 2016-09-21 | Disposition: A | Discharge status: Home / Self Care | Payer: BLUE CROSS/BLUE SHIELD | Attending: Emergency Medicine | Admitting: Emergency Medicine

## 2016-09-21 DIAGNOSIS — Y733 Surgical instruments, materials and gastroenterology and urology devices (including sutures) associated with adverse incidents: Secondary | ICD-10-CM | POA: Diagnosis not present

## 2016-09-21 DIAGNOSIS — T8131XA Disruption of external operation (surgical) wound, not elsewhere classified, initial encounter: Secondary | ICD-10-CM | POA: Insufficient documentation

## 2016-09-21 DIAGNOSIS — F17210 Nicotine dependence, cigarettes, uncomplicated: Secondary | ICD-10-CM | POA: Diagnosis not present

## 2016-09-21 DIAGNOSIS — T148XXA Other injury of unspecified body region, initial encounter: Secondary | ICD-10-CM

## 2016-09-21 DIAGNOSIS — L089 Local infection of the skin and subcutaneous tissue, unspecified: Secondary | ICD-10-CM | POA: Diagnosis present

## 2016-09-21 DIAGNOSIS — T8130XA Disruption of wound, unspecified, initial encounter: Secondary | ICD-10-CM

## 2016-09-21 LAB — CBC
HEMATOCRIT: 43.1 % (ref 39.0–52.0)
Hemoglobin: 14.8 g/dL (ref 13.0–17.0)
MCH: 30 pg (ref 26.0–34.0)
MCHC: 34.3 g/dL (ref 30.0–36.0)
MCV: 87.2 fL (ref 78.0–100.0)
Platelets: 223 10*3/uL (ref 150–400)
RBC: 4.94 MIL/uL (ref 4.22–5.81)
RDW: 12.5 % (ref 11.5–15.5)
WBC: 9.1 10*3/uL (ref 4.0–10.5)

## 2016-09-21 LAB — URINALYSIS, ROUTINE W REFLEX MICROSCOPIC
Bilirubin Urine: NEGATIVE
GLUCOSE, UA: NEGATIVE mg/dL
Hgb urine dipstick: NEGATIVE
KETONES UR: NEGATIVE mg/dL
LEUKOCYTES UA: NEGATIVE
NITRITE: NEGATIVE
PH: 7 (ref 5.0–8.0)
Protein, ur: NEGATIVE mg/dL
SPECIFIC GRAVITY, URINE: 1.014 (ref 1.005–1.030)

## 2016-09-21 LAB — COMPREHENSIVE METABOLIC PANEL
ALT: 34 U/L (ref 17–63)
ANION GAP: 7 (ref 5–15)
AST: 26 U/L (ref 15–41)
Albumin: 4.3 g/dL (ref 3.5–5.0)
Alkaline Phosphatase: 47 U/L (ref 38–126)
BILIRUBIN TOTAL: 0.7 mg/dL (ref 0.3–1.2)
BUN: 5 mg/dL — ABNORMAL LOW (ref 6–20)
CHLORIDE: 106 mmol/L (ref 101–111)
CO2: 26 mmol/L (ref 22–32)
Calcium: 9.2 mg/dL (ref 8.9–10.3)
Creatinine, Ser: 0.81 mg/dL (ref 0.61–1.24)
GFR calc Af Amer: >60 mL/min (ref >60)
Glucose, Bld: 106 mg/dL — ABNORMAL HIGH (ref 65–99)
POTASSIUM: 4 mmol/L (ref 3.5–5.1)
Sodium: 139 mmol/L (ref 135–145)
TOTAL PROTEIN: 6.6 g/dL (ref 6.5–8.1)

## 2016-09-21 LAB — LIPASE, BLOOD: LIPASE: 33 U/L (ref 11–51)

## 2016-09-21 LAB — I-STAT CG4 LACTIC ACID, ED: Lactic Acid, Venous: 0.98 mmol/L (ref 0.5–1.9)

## 2016-09-21 MED ORDER — CEPHALEXIN 250 MG PO CAPS
1000.0000 mg | ORAL_CAPSULE | Freq: Once | ORAL | Status: AC
  Administered 2016-09-21: 1000 mg via ORAL
  Filled 2016-09-21: qty 4

## 2016-09-21 MED ORDER — CEPHALEXIN 500 MG PO CAPS: 1000.0000 mg | ORAL_CAPSULE | Freq: Two times a day (BID) | ORAL | 0 refills | Status: DC

## 2016-09-21 MED ORDER — MUPIROCIN CALCIUM 2 % EX CREA
TOPICAL_CREAM | Freq: Once | CUTANEOUS | Status: AC
  Administered 2016-09-21: 1 via TOPICAL
  Filled 2016-09-21: qty 15

## 2016-09-21 MED ORDER — MUPIROCIN 2 % EX OINT: 1.0000 "application " | TOPICAL_OINTMENT | Freq: Two times a day (BID) | CUTANEOUS | 0 refills | Status: AC

## 2016-09-21 MED ORDER — ONDANSETRON 4 MG PO TBDP: 4.0000 mg | ORAL_TABLET | ORAL | 0 refills | Status: DC | PRN

## 2016-09-21 NOTE — ED Provider Notes (Signed)
[facility]-EMERGENCY DEPT Provider Note   CSN: [id number] Arrival date & time: 09/21/16  1548     History   Chief Complaint Chief Complaint  Patient presents with  . Emesis  . Wound Infection    HPI Bernard Waters is a 21 y.o. male.  HPI Patient is 12 days s/p appendectomy. 4 days ago he noted that his wound opened up and his right upper abdomen. He noted some material protruding and he pulled out a blackish looking thing. He reports since then he is noticeable but a redness around it. In addition he's felt somewhat nauseated and generally weak. No fevers or chills. No diarrhea. No significant intra-abdominal pain. History reviewed. No pertinent past medical history.  Patient Active Problem List   Diagnosis Date Noted  . Acute appendicitis 09/08/2016    Past Surgical History:  Procedure Laterality Date  . LAPAROSCOPIC APPENDECTOMY N/A 09/08/2016   Procedure: APPENDECTOMY LAPAROSCOPIC;  Surgeon: Manus RuddMatthew Tsuei, MD;  Location: [facility] OR;  Service: General;  Laterality: N/A;       Home Medications    Prior to Admission medications   Medication Sig Start Date End Date Taking? Authorizing Provider  acetaminophen (TYLENOL) 500 MG tablet Take 2 tablets (1,000 mg total) by mouth every 8 (eight) hours. 09/08/16   Sherrie GeorgeJennings, Willard, PA-C  cephALEXin (KEFLEX) 500 MG capsule Take 2 capsules (1,000 mg total) by mouth 2 (two) times daily. 09/21/16   Arby BarrettePfeiffer, Sherill Mangen, MD  ibuprofen (ADVIL,MOTRIN) 200 MG tablet You can take 2-3 tablets safely every 6 hours as needed for pain. You can use this to augment Tylenol, or you can use it as a first line pain medication in place of Tylenol. 09/08/16   Sherrie GeorgeJennings, Willard, PA-C  mupirocin ointment (BACTROBAN) 2 % Place 1 application into the nose 2 (two) times daily. 09/21/16   Arby BarrettePfeiffer, Iniya Matzek, MD  ondansetron (ZOFRAN ODT) 4 MG disintegrating tablet Take 1 tablet (4 mg total) by mouth every 4 (four) hours as needed for nausea or vomiting. 09/21/16   Arby BarrettePfeiffer, Cornie Mccomber,  MD  oxyCODONE (OXY IR/ROXICODONE) 5 MG immediate release tablet Use this for pain not relieved by Tylenol or ibuprofen. We will not refill this prescription. 09/08/16   Sherrie GeorgeJennings, Willard, PA-C    Family History No family history on file.  Social History Social History  Substance Use Topics  . Smoking status: Current Every Day Smoker    Packs/day: 0.50    Types: Cigarettes  . Smokeless tobacco: Current User    Types: Chew  . Alcohol use Yes     Comment: ocasional     Allergies   No known allergies   Review of Systems Review of Systems 10 Systems reviewed and are negative for acute change except as noted in the HPI.   Physical Exam Updated Vital Signs BP (!) 103/50   Pulse 68   Temp 98.4 F (36.9 C) (Oral)   Resp 16   SpO2 97%   Physical Exam  Constitutional: He is oriented to person, place, and time. He appears well-developed and well-nourished.  HENT:  Head: Normocephalic and atraumatic.  Eyes: Conjunctivae are normal.  Neck: Neck supple.  Cardiovascular: Normal rate and regular rhythm.   No murmur heard. Pulmonary/Chest: Effort normal and breath sounds normal. No respiratory distress.  Abdominal: Soft. Bowel sounds are normal. He exhibits no distension. There is no tenderness.  The patient abdomen is soft without any guarding. Lower abdomen is nontender. Patient has a 1 cm trocar incisional wound in the right upper  quadrant. This has dehisced with one small piece of suture visible. No active drainage or abscess that expresses any purulent material. There is a 5 mm rim of erythema around this.  Musculoskeletal: Normal range of motion.  Neurological: He is alert and oriented to person, place, and time. No cranial nerve deficit. He exhibits normal muscle tone. Coordination normal.  Skin: Skin is warm and dry.  Psychiatric: He has a normal mood and affect.  Nursing note and vitals reviewed.    ED Treatments / Results  Labs (all labs ordered are listed, but only  abnormal results are displayed) Labs Reviewed  COMPREHENSIVE METABOLIC PANEL - Abnormal; Notable for the following:       Result Value   Glucose, Bld 106 (*)    BUN 5 (*)    All other components within normal limits  LIPASE, BLOOD  CBC  URINALYSIS, ROUTINE W REFLEX MICROSCOPIC  I-STAT CG4 LACTIC ACID, ED  I-STAT CG4 LACTIC ACID, ED    EKG  EKG Interpretation None       Radiology No results found.  Procedures Procedures (including critical care time)  Medications Ordered in ED Medications  mupirocin cream (BACTROBAN) 2 % (not administered)  cephALEXin (KEFLEX) capsule 1,000 mg (1,000 mg Oral Given 09/21/16 2208)     Initial Impression / Assessment and Plan / ED Course  I have reviewed the triage vital signs and the nursing notes.  Pertinent labs & imaging results that were available during my care of the patient were reviewed by me and considered in my medical decision making (see chart for details).     Final Clinical Impressions(s) / ED Diagnoses   Final diagnoses:  Wound infection  Abdominal wound dehiscence, initial encounter  Patient presents status post appendectomy. His right upper quadrant trocar wound has dehisced a small amount. There is no evidence of abscess beneath it or fluid collection. Patient reports some constitutional symptoms of nausea and general weakness. Clinically he is well and appearance without signs of toxicity. Diagnostic labs are within normal limits. Patient will be placed on Keflex for wound dehiscence with mild erythema. He'll given Zofran if needed for nausea. He is counseled to follow-up with general surgery.  New Prescriptions New Prescriptions   CEPHALEXIN (KEFLEX) 500 MG CAPSULE    Take 2 capsules (1,000 mg total) by mouth 2 (two) times daily.   MUPIROCIN OINTMENT (BACTROBAN) 2 %    Place 1 application into the nose 2 (two) times daily.   ONDANSETRON (ZOFRAN ODT) 4 MG DISINTEGRATING TABLET    Take 1 tablet (4 mg total) by mouth  every 4 (four) hours as needed for nausea or vomiting.     Arby Barrette, MD 09/21/16 2209

## 2016-09-21 NOTE — Discharge Instructions (Signed)
Keep wound clean and dry and apply Bactroban ointment twice a day.

## 2016-09-21 NOTE — ED Notes (Signed)
Pt stable, understands discharge instructions, and reasons for return.   

## 2016-09-21 NOTE — ED Triage Notes (Signed)
Pt reports appendectomy 2 weeks ago, reports stitch in abdomen was supposed to dissolve, reports he saw something black come out of the wound 4 days ago, area is now red around it. Pt also reports n/v/d.

## 2016-10-10 NOTE — Addendum Note (Signed)
Addendum  created 10/10/16 0901 by Johnika Escareno, MD   Sign clinical note    

## 2018-02-11 IMAGING — CT CT HEAD W/O CM
4 series · 15 of 47 positions shown, 17 images · non-contrast
Comparison: None.

CLINICAL DATA: Headache, onset at [DATE] and accompanied by nausea.

EXAM:
CT HEAD WITHOUT CONTRAST
TECHNIQUE: Contiguous axial images were obtained from the base of the skull
through the vertex without intravenous contrast.

[Series 4: head without · axial · non-contrast · 0.47mm/px · z∈[-52,+68]mm · 7 of 33 slices shown, 9 images]
[im 5/33  brain]
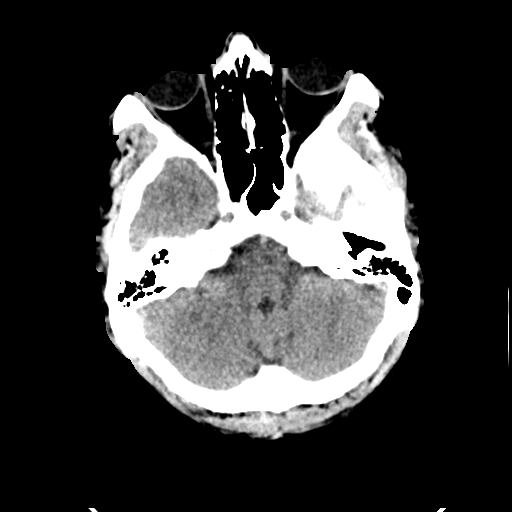
[im 5/33  bone]
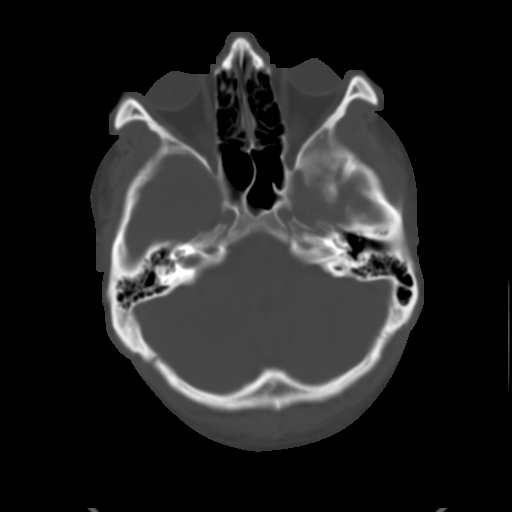
[im 9/33  brain]
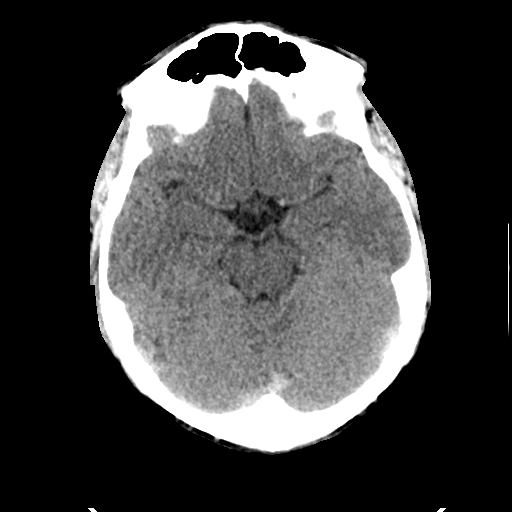
[im 13/33  brain]
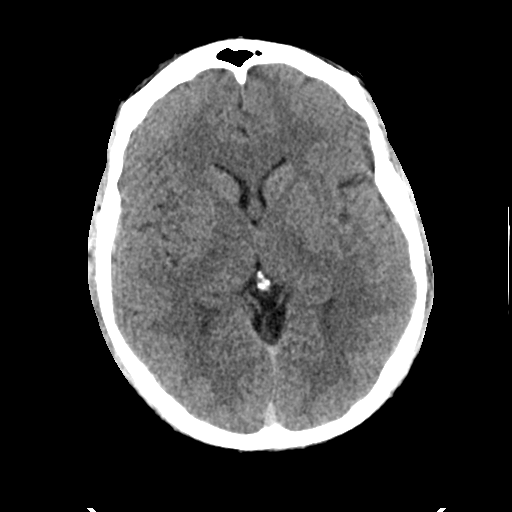
[im 17/33  brain]
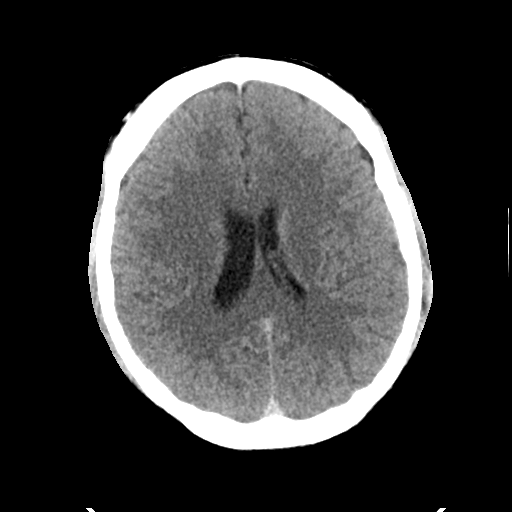
[im 21/33  brain]
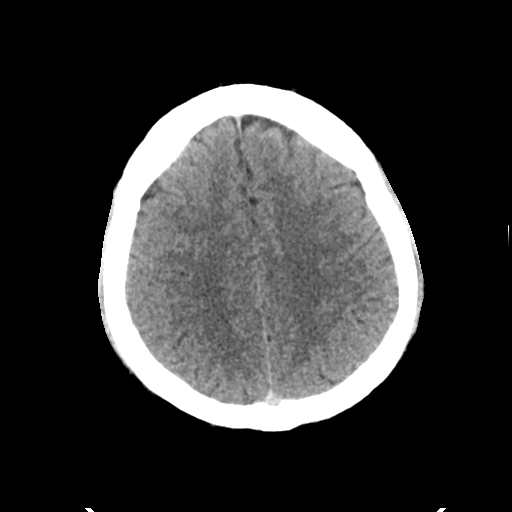
[im 21/33  bone]
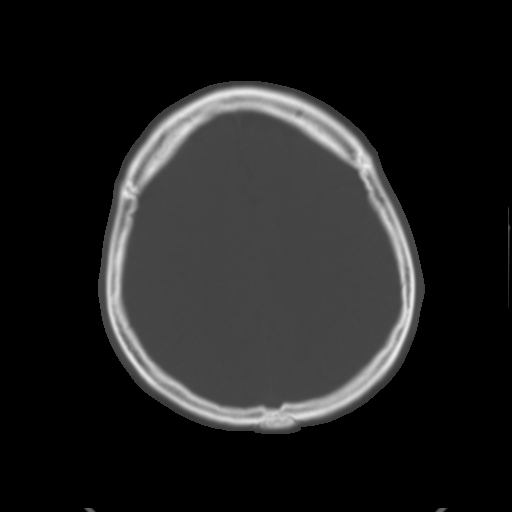
[im 25/33  brain]
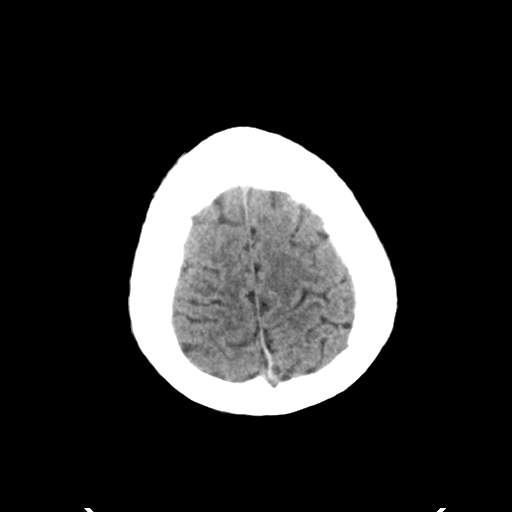
[im 29/33  brain]
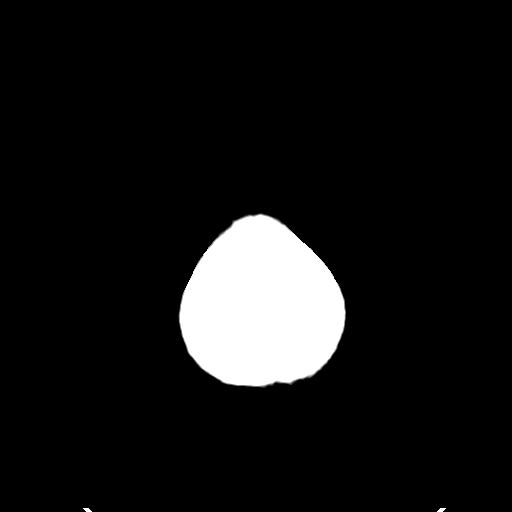

[Series 5: head bone · axial · 0.47mm/px · z∈[-56,-40]mm · 2 of 81 slices shown]
[im 9/81  bone]
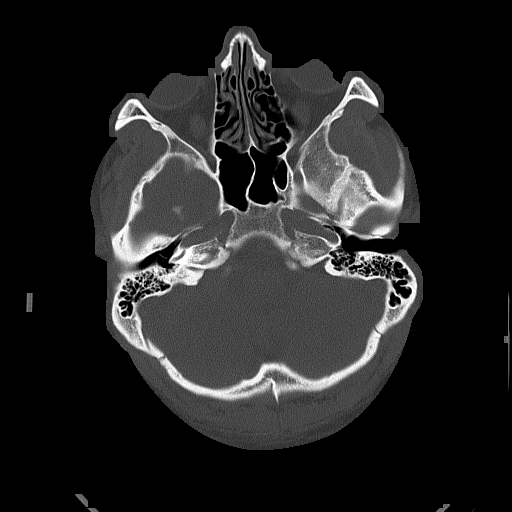
[im 17/81  bone]
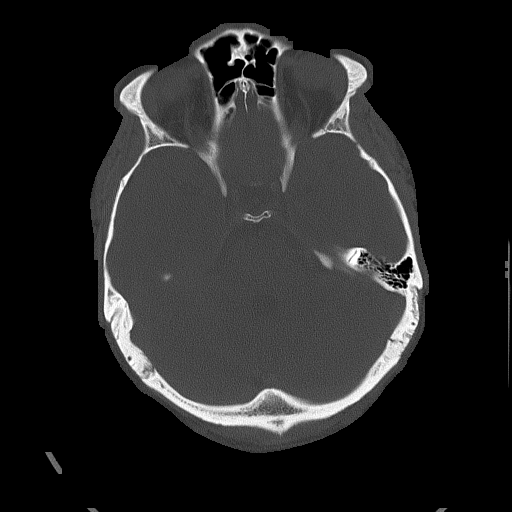

[Series 6: head without cor · coronal · non-contrast · 0.33mm/px · 3 of 73 slices shown]
[im 25/73  brain]
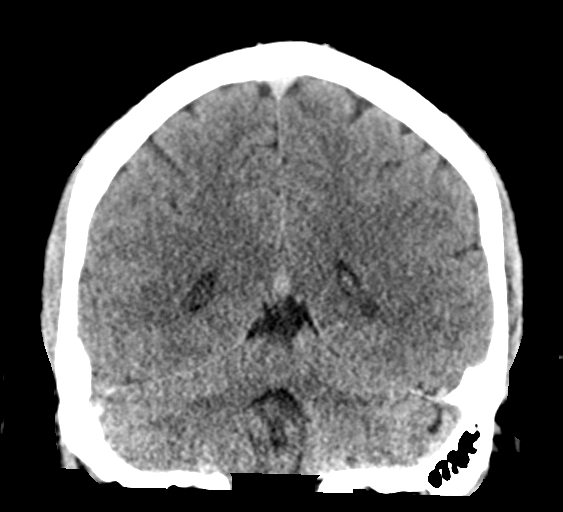
[im 33/73  brain]
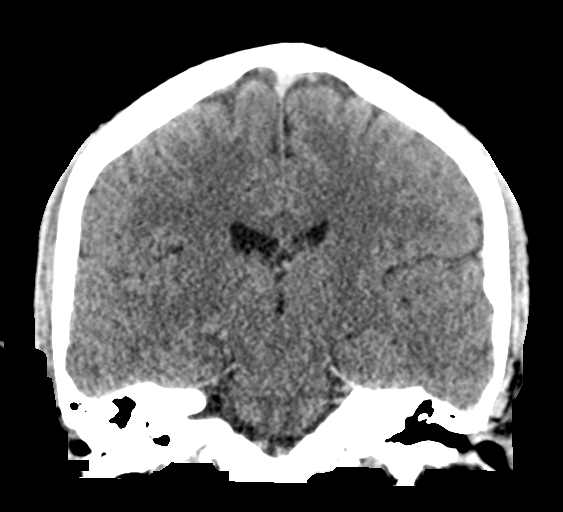
[im 41/73  brain]
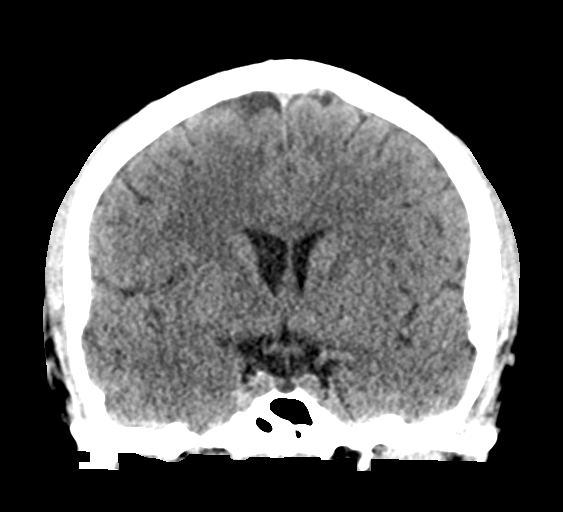

[Series 7: head without sag · sagittal · non-contrast · 0.32mm/px · 3 of 67 slices shown]
[im 23/67  brain]
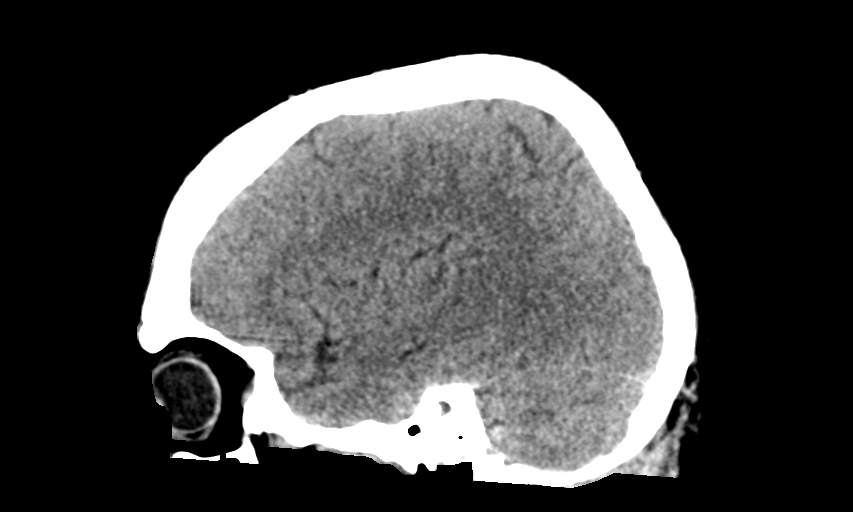
[im 34/67  brain]
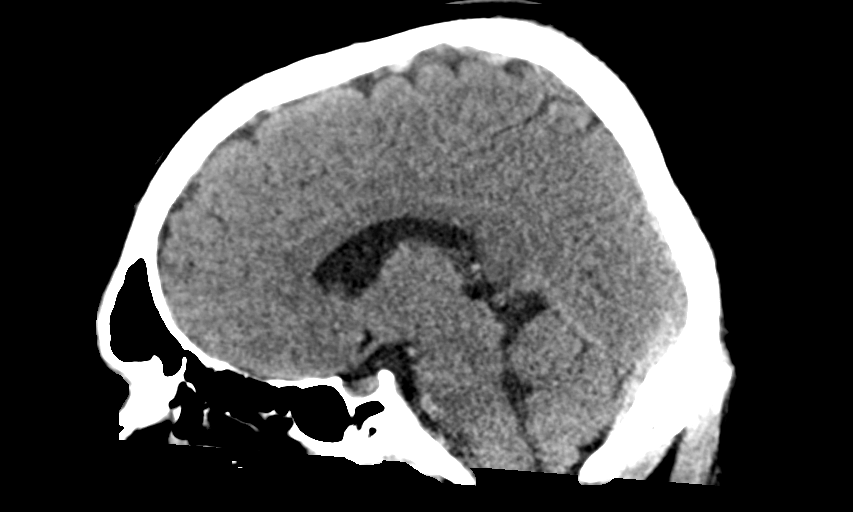
[im 45/67  brain]
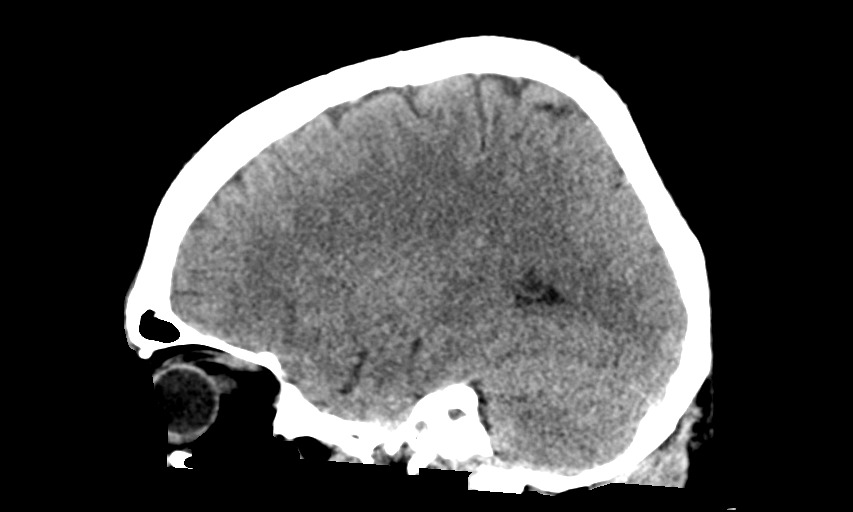

[15 of 47 positions shown; findings below may reference images not displayed]

FINDINGS: Brain: There is no intracranial hemorrhage, mass or evidence of
acute infarction. There is no extra-axial fluid collection. Gray
matter and white matter appear normal. Cerebral volume is normal for
age. Brainstem and posterior fossa are unremarkable. The CSF spaces
appear normal.

Vascular: No hyperdense vessel or unexpected calcification.

Skull: Normal. Negative for fracture or focal lesion.

Sinuses/Orbits: Retention cysts in the right maxillary sinus,
incompletely imaged.

Other: None.
IMPRESSION: Normal brain

## 2018-11-21 ENCOUNTER — Encounter: Payer: Self-pay | Admitting: Emergency Medicine

## 2018-11-21 ENCOUNTER — Ambulatory Visit
Admission: EM | Admit: 2018-11-21 | Discharge: 2018-11-21 | Disposition: A | Discharge status: Home / Self Care | Payer: Commercial Managed Care - PPO | Attending: Physician Assistant | Admitting: Physician Assistant

## 2018-11-21 ENCOUNTER — Other Ambulatory Visit: Payer: Self-pay

## 2018-11-21 DIAGNOSIS — R112 Nausea with vomiting, unspecified: Secondary | ICD-10-CM

## 2018-11-21 HISTORY — DX: Chronic sinusitis, unspecified: J32.9

## 2018-11-21 LAB — POCT URINALYSIS DIP (MANUAL ENTRY)
Bilirubin, UA: NEGATIVE
Glucose, UA: NEGATIVE mg/dL
Ketones, POC UA: NEGATIVE mg/dL
Leukocytes, UA: NEGATIVE
Nitrite, UA: NEGATIVE
Protein Ur, POC: NEGATIVE mg/dL
Spec Grav, UA: 1.025 (ref 1.010–1.025)
Urobilinogen, UA: 0.2 E.U./dL
pH, UA: 7 (ref 5.0–8.0)

## 2018-11-21 MED ORDER — OMEPRAZOLE 20 MG PO CPDR: 20.0000 mg | DELAYED_RELEASE_CAPSULE | Freq: Every day | ORAL | 1 refills | Status: AC

## 2018-11-21 MED ORDER — ONDANSETRON 4 MG PO TBDP: 4.0000 mg | ORAL_TABLET | Freq: Three times a day (TID) | ORAL | 0 refills | Status: AC | PRN

## 2018-11-21 NOTE — Discharge Instructions (Signed)
You have labs pending.  We will call you with results

## 2018-11-21 NOTE — ED Triage Notes (Signed)
Pt presents to [facility] for general nausea, worsened with smells x 1-2 months.  Patient also states he had emesis in his mask at work last week.   Pt states he also has GERD issues with chest pain.  Sometimes the pain is centralized, with belching/emesis.  Sometimes the pain radiates from the ring finger of his right arm up into his chest.  Patient states he has also had several deaths of loved ones recently and is experiencing some lower energy since.  Patient also c/o waking up with a cough every morning that causes him to vomit.

## 2018-11-21 NOTE — ED Provider Notes (Signed)
[name]    CSN: [id number] Arrival date & time: 11/21/18  1232      History   Chief Complaint Chief Complaint  Patient presents with  . Emesis  . Chest Pain    HPI Bernard Waters is a 23 y.o. male.   The history is provided by the patient. No language interpreter was used.  Emesis Severity:  Moderate Duration:  1 month Timing:  Constant Quality:  Unable to specify Progression:  Worsening Chronicity:  New Relieved by:  Nothing Worsened by:  Nothing Ineffective treatments:  None tried Associated symptoms: no abdominal pain   Risk factors: no suspect food intake   Chest Pain Associated symptoms: vomiting   Associated symptoms: no abdominal pain   Pt reports reflux.  Pt states multiple episodes of vomiting.    Past Medical History:  Diagnosis Date  . Sinusitis with nasal polyps     Patient Active Problem List   Diagnosis Date Noted  . Acute appendicitis 09/08/2016    Past Surgical History:  Procedure Laterality Date  . APPENDECTOMY    . LAPAROSCOPIC APPENDECTOMY N/A 09/08/2016   Procedure: APPENDECTOMY LAPAROSCOPIC;  Surgeon: Donnie Mesa, MD;  Location: [facility];  Service: General;  Laterality: N/A;       Home Medications    Prior to Admission medications   Medication Sig Start Date End Date Taking? Authorizing Provider  acetaminophen (TYLENOL) 500 MG tablet Take 2 tablets (1,000 mg total) by mouth every 8 (eight) hours. 09/08/16   Earnstine Regal, PA-C  ibuprofen (ADVIL,MOTRIN) 200 MG tablet You can take 2-3 tablets safely every 6 hours as needed for pain. You can use this to augment Tylenol, or you can use it as a first line pain medication in place of Tylenol. 09/08/16   Earnstine Regal, PA-C  mupirocin ointment (BACTROBAN) 2 % Place 1 application into the nose 2 (two) times daily. 09/21/16   Charlesetta Shanks, MD  omeprazole (PRILOSEC) 20 MG capsule Take 1 capsule (20 mg total) by mouth daily. 11/21/18 11/21/19  Fransico Meadow, PA-C   ondansetron (ZOFRAN ODT) 4 MG disintegrating tablet Take 1 tablet (4 mg total) by mouth every 8 (eight) hours as needed for nausea or vomiting. 11/21/18   Fransico Meadow, PA-C    Family History History reviewed. No pertinent family history.  Social History Social History   Tobacco Use  . Smoking status: Current Every Day Smoker    Packs/day: 0.50    Types: Cigarettes  . Smokeless tobacco: Current User    Types: Chew  Substance Use Topics  . Alcohol use: Yes    Comment: ocasional  . Drug use: Yes    Types: Marijuana    Comment: Patient denies at triage.     Allergies   No known allergies   Review of Systems Review of Systems  Cardiovascular: Positive for chest pain.  Gastrointestinal: Positive for vomiting. Negative for abdominal pain.  All other systems reviewed and are negative.    Physical Exam Triage Vital Signs ED Triage Vitals  Enc Vitals Group     BP 11/21/18 1248 121/80     Pulse Rate 11/21/18 1248 95     Resp 11/21/18 1248 16     Temp 11/21/18 1248 98.2 F (36.8 C)     Temp Source 11/21/18 1248 Oral     SpO2 11/21/18 1248 95 %     Weight --      Height --      Head Circumference --  Peak Flow --      Pain Score 11/21/18 1255 6     Pain Loc --      Pain Edu? --      Excl. in GC? --    No data found.  Updated Vital Signs BP 121/80 (BP Location: Left Arm)   Pulse 95   Temp 98.2 F (36.8 C) (Oral)   Resp 16   SpO2 95%   Visual Acuity Right Eye Distance:   Left Eye Distance:   Bilateral Distance:    Right Eye Near:   Left Eye Near:    Bilateral Near:     Physical Exam Vitals signs and nursing note reviewed.  Constitutional:      Appearance: He is well-developed.  HENT:     Head: Normocephalic and atraumatic.  Eyes:     Conjunctiva/sclera: Conjunctivae normal.  Neck:     Musculoskeletal: Neck supple.  Cardiovascular:     Rate and Rhythm: Normal rate and regular rhythm.     Heart sounds: Normal heart sounds. No murmur.   Pulmonary:     Effort: Pulmonary effort is normal. No respiratory distress.     Breath sounds: Normal breath sounds.  Abdominal:     Palpations: Abdomen is soft.     Tenderness: There is no abdominal tenderness.  Skin:    General: Skin is warm and dry.  Neurological:     General: No focal deficit present.     Mental Status: He is alert.  Psychiatric:        Mood and Affect: Mood normal.      UC Treatments / Results  Labs (all labs ordered are listed, but only abnormal results are displayed) Labs Reviewed  POCT URINALYSIS DIP (MANUAL ENTRY) - Abnormal; Notable for the following components:      Result Value   Blood, UA trace-lysed (*)    All other components within normal limits  CBC WITH DIFFERENTIAL/PLATELET  COMPREHENSIVE METABOLIC PANEL  LIPASE    EKG   Radiology No results found.  Procedures Procedures (including critical care time)  Medications Ordered in UC Medications - No data to display  Initial Impression / Assessment and Plan / UC Course  I have reviewed the triage vital signs and the nursing notes.  Pertinent labs & imaging results that were available during my care of the patient were reviewed by me and considered in my medical decision making (see chart for details).     MDM  UA is normal.  Cbc,cmet and lipase pending  I will start pt on prilosec.  Pt given zofran for nausea.   Final Clinical Impressions(s) / UC Diagnoses   Final diagnoses:  Nausea and vomiting, intractability of vomiting not specified, unspecified vomiting type     Discharge Instructions     You have labs pending.  We will call you with results   ED Prescriptions    Medication Sig Dispense Auth. Provider   ondansetron (ZOFRAN ODT) 4 MG disintegrating tablet Take 1 tablet (4 mg total) by mouth every 8 (eight) hours as needed for nausea or vomiting. 20 tablet Zyrell Carmean K, [place]-C   omeprazole (PRILOSEC) 20 MG capsule Take 1 capsule (20 mg total) by mouth daily. 30  capsule Elson AreasSofia, Anays Detore K, [place]-C     Controlled Substance Prescriptions [place] Controlled Substance Registry consulted? Not Applicable  An After Visit Summary was printed and given to the patient.    Elson AreasSofia, Angenette Daily K, [place]-C 11/21/18 1352

## 2018-11-21 NOTE — ED Notes (Signed)
Patient able to ambulate independently  

## 2018-11-22 LAB — LIPASE: Lipase: 24 U/L (ref 13–78)

## 2018-11-22 LAB — CBC WITH DIFFERENTIAL/PLATELET
Basophils Absolute: 0 10*3/uL (ref 0.0–0.2)
Basos: 0 %
EOS (ABSOLUTE): 0.2 10*3/uL (ref 0.0–0.4)
Eos: 2 %
Hematocrit: 47.6 % (ref 37.5–51.0)
Hemoglobin: 16.6 g/dL (ref 13.0–17.7)
Immature Grans (Abs): 0.1 10*3/uL (ref 0.0–0.1)
Immature Granulocytes: 1 %
Lymphocytes Absolute: 2.9 10*3/uL (ref 0.7–3.1)
Lymphs: 35 %
MCH: 31 pg (ref 26.6–33.0)
MCHC: 34.9 g/dL (ref 31.5–35.7)
MCV: 89 fL (ref 79–97)
Monocytes Absolute: 0.7 10*3/uL (ref 0.1–0.9)
Monocytes: 8 %
Neutrophils Absolute: 4.6 10*3/uL (ref 1.4–7.0)
Neutrophils: 54 %
Platelets: 211 10*3/uL (ref 150–450)
RBC: 5.35 x10E6/uL (ref 4.14–5.80)
RDW: 12.9 % (ref 11.6–15.4)
WBC: 8.5 10*3/uL (ref 3.4–10.8)

## 2018-11-22 LAB — COMPREHENSIVE METABOLIC PANEL
ALT: 27 IU/L (ref 0–44)
AST: 19 IU/L (ref 0–40)
Albumin/Globulin Ratio: 1.8 (ref 1.2–2.2)
Albumin: 4.8 g/dL (ref 4.1–5.2)
Alkaline Phosphatase: 61 IU/L (ref 39–117)
BUN/Creatinine Ratio: 15 (ref 9–20)
BUN: 12 mg/dL (ref 6–20)
Bilirubin Total: 0.3 mg/dL (ref 0.0–1.2)
CO2: 21 mmol/L (ref 20–29)
Calcium: 9.4 mg/dL (ref 8.7–10.2)
Chloride: 106 mmol/L (ref 96–106)
Creatinine, Ser: 0.78 mg/dL (ref 0.76–1.27)
GFR calc Af Amer: 146 mL/min/{1.73_m2} (ref >59)
GFR calc non Af Amer: 126 mL/min/{1.73_m2} (ref >59)
Globulin, Total: 2.6 g/dL (ref 1.5–4.5)
Glucose: 97 mg/dL (ref 65–99)
Potassium: 5.1 mmol/L (ref 3.5–5.2)
Sodium: 139 mmol/L (ref 134–144)
Total Protein: 7.4 g/dL (ref 6.0–8.5)

## 2018-11-25 ENCOUNTER — Telehealth (HOSPITAL_COMMUNITY): Payer: Self-pay | Admitting: Emergency Medicine

## 2018-11-25 NOTE — Telephone Encounter (Signed)
Normal labs, attempted to reach patient, no answer

## 2019-01-04 ENCOUNTER — Encounter: Payer: Self-pay | Admitting: Neurology

## 2019-01-04 ENCOUNTER — Telehealth: Payer: Self-pay

## 2019-01-04 ENCOUNTER — Other Ambulatory Visit: Payer: Self-pay

## 2019-01-04 ENCOUNTER — Telehealth (INDEPENDENT_AMBULATORY_CARE_PROVIDER_SITE_OTHER): Payer: Commercial Managed Care - PPO | Admitting: Neurology

## 2019-01-04 VITALS — Ht 73.0 in | Wt 234.0 lb

## 2019-01-04 DIAGNOSIS — R202 Paresthesia of skin: Secondary | ICD-10-CM

## 2019-01-04 NOTE — Telephone Encounter (Signed)
Letter at front for patient to come pick up.

## 2019-01-04 NOTE — Progress Notes (Signed)
New Patient Virtual Visit via Video Note The purpose of this virtual visit is to provide medical care while limiting exposure to the novel coronavirus.    Consent was obtained for video visit:  Yes.   Answered questions that patient had about telehealth interaction:  Yes.   I discussed the limitations, risks, security and privacy concerns of performing an evaluation and management service by telemedicine. I also discussed with the patient that there may be a patient responsible charge related to this service. The patient expressed understanding and agreed to proceed.  Pt location: Home Physician Location: office Name of referring provider:  Enid Skeens., MD I connected with Bernard Waters at patients initiation/request on 01/04/2019 at 10:50 AM EDT by video enabled telemedicine application and verified that I am speaking with the correct person using two identifiers. Pt MRN:  [id number] Pt DOB:  06/15/1995 Video Participants:  Bernard Waters    History of Present Illness: Bernard Waters is a 23 y.o. right-handed male with tobacco use presenting for evaluation of bilateral hand numbness.  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  After about a month working here, he began having numbness and tingling in the thumb, index finger and middle finger bilaterally.  Sometimes, he has pain that radiates from his wrist into his forearm and even into the chest.  He has a lot of repetitive work to do and felt that this made symptoms worse.  He does wake up often with his hands falling asleep and has noticed that extending them straight can help alleviate the symptoms.  He has some weakness in the hands with grip.  His PCP has kept him out of work for the past 3 weeks and he feels that symptoms are slightly better.  He denies any neck pain.  Past Medical History:  Diagnosis Date  . Sinusitis with nasal polyps     Past  Surgical History:  Procedure Laterality Date  . APPENDECTOMY    . LAPAROSCOPIC APPENDECTOMY N/A 09/08/2016   Procedure: APPENDECTOMY LAPAROSCOPIC;  Surgeon: Donnie Mesa, MD;  Location: [place];  Service: General;  Laterality: N/A;     Medications:  Outpatient Encounter Medications as of 01/04/2019  Medication Sig  . acetaminophen (TYLENOL) 500 MG tablet Take 2 tablets (1,000 mg total) by mouth every 8 (eight) hours.  Marland Kitchen ibuprofen (ADVIL,MOTRIN) 200 MG tablet You can take 2-3 tablets safely every 6 hours as needed for pain. You can use this to augment Tylenol, or you can use it as a first line pain medication in place of Tylenol.  . mupirocin ointment (BACTROBAN) 2 % Place 1 application into the nose 2 (two) times daily.  Marland Kitchen omeprazole (PRILOSEC) 20 MG capsule Take 1 capsule (20 mg total) by mouth daily.  . ondansetron (ZOFRAN ODT) 4 MG disintegrating tablet Take 1 tablet (4 mg total) by mouth every 8 (eight) hours as needed for nausea or vomiting.   No facility-administered encounter medications on file as of 01/04/2019.     Allergies:  Allergies  Allergen Reactions  . No Known Allergies     Family History: Family History  Problem Relation Age of Onset  . Suicidality Brother   . Cancer Maternal Grandmother   . Hypertension Brother     Social History: Social History   Tobacco Use  . Smoking status: Current Every Day Smoker    Packs/day: 0.50    Years:  9.00    Pack years: 4.50    Types: Cigarettes  . Smokeless tobacco: Current User    Types: Chew  Substance Use Topics  . Alcohol use: Not Currently    Comment: ocasional  . Drug use: Yes    Types: Marijuana    Comment: Patient denies at triage.   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    EYES: No visual changes or eye pain ENT: No hearing changes.  No history of nose bleeds.   RESPIRATORY: No cough, wheezing and shortness of breath.   CARDIOVASCULAR: Negative for chest pain, and palpitations.   GI: Negative for abdominal discomfort, blood in stools or black stools.  No recent change in bowel habits.   GU:  No history of incontinence.   MUSCLOSKELETAL: No history of joint pain or swelling.  No myalgias.   SKIN: Negative for lesions, rash, and itching.   HEMATOLOGY/ONCOLOGY: Negative for prolonged bleeding, bruising easily, and swollen nodes.  No history of cancer.   ENDOCRINE: Negative for cold or heat intolerance, polydipsia or goiter.   PSYCH:  No depression or anxiety symptoms.   NEURO: As Above.   Vital Signs:  Ht 6\' 1"  (1.854 m)   Wt 234 lb (106.1 kg)   BMI 30.87 kg/m    General Medical Exam:  Well appearing, comfortable.  Nonlabored breathing.  No deformity or edema.  No rash.  Neurological Exam: MENTAL STATUS including orientation to time, place, person, recent and remote memory, attention span and concentration, language, and fund of knowledge is normal.  Speech is not dysarthric.  CRANIAL NERVES:  Normal conjugate, extra-ocular eye movements in all directions of gaze.  No ptosis.  Normal facial symmetry and movements.  Normal shoulder shrug and head rotation.  Tongue is midline.  MOTOR:  Antigravity in all extremities.  No abnormal movements.  No pronator drift.   SENSORY & REFLEXES: Unable to test.   COORDINATION/GAIT: Normal finger to nose bilaterally.  Intact rapid alternating movements bilaterally.    Gait narrow based and stable. Stressed gait intact.    IMPRESSION: Bilateral carpal tunnel syndrome is suspected given the distribution of his hand paresthesias.  - NCS/EMG bilateral upper extremities to localize symptoms  - Start using wrist splint bilaterally at bedtime  -Letter will be provided for patient to return to work with light duty only.     Further recommendations following electrodiagnostic testing and in office evaluation.  Follow Up Instructions:  I discussed the assessment and treatment plan with the patient. The patient was provided an opportunity to ask questions and all were answered. The patient agreed with the plan and demonstrated an understanding of the instructions.   The patient was advised to call back or seek an in-person evaluation if the symptoms worsen or if the condition fails to improve as anticipated.  Total Time spent:  45 min   [place] Chardonika K Shanel Prazak, DO

## 2019-01-09 ENCOUNTER — Telehealth: Payer: Self-pay | Admitting: Neurology

## 2019-01-09 NOTE — Telephone Encounter (Signed)
Tried leaving 2 messages no answer, will leave at front desk

## 2019-01-09 NOTE — Telephone Encounter (Signed)
Patient is calling in about paoerwork for his job changing him to light duty. He was checking on this. Thanks!

## 2019-01-09 NOTE — Telephone Encounter (Signed)
Patient needs a note before 3:00 today  He states that he really needs this done ASAP

## 2019-01-09 NOTE — Telephone Encounter (Signed)
Note ready for pick up, no answer will leave at front office

## 2019-02-21 ENCOUNTER — Encounter: Payer: Commercial Managed Care - PPO | Admitting: Neurology

## 2019-02-21 DIAGNOSIS — Z029 Encounter for administrative examinations, unspecified: Secondary | ICD-10-CM
